# Patient Record
Sex: Male | Born: 1985
Health system: Southern US, Community
[De-identification: ages and names within clinical notes are randomized; demographics above are authoritative.]

## PROBLEM LIST (undated history)

## (undated) ENCOUNTER — Ambulatory Visit: Payer: Self-pay | Source: Home / Self Care

---

## 2013-02-18 ENCOUNTER — Encounter: Payer: Self-pay | Admitting: Podiatry

## 2013-02-18 ENCOUNTER — Ambulatory Visit (INDEPENDENT_AMBULATORY_CARE_PROVIDER_SITE_OTHER): Payer: BC Managed Care – PPO | Admitting: Podiatry

## 2013-02-18 VITALS — BP 105/67 | HR 82 | Resp 16 | Ht 74.0 in | Wt 250.0 lb

## 2013-02-18 DIAGNOSIS — M109 Gout, unspecified: Secondary | ICD-10-CM

## 2013-02-18 DIAGNOSIS — M2010 Hallux valgus (acquired), unspecified foot: Secondary | ICD-10-CM

## 2013-02-18 NOTE — Progress Notes (Signed)
Subjective:     Patient ID: Matthew Montoya, male   DOB: 10-Nov-1985, 27 y.o.   MRN: [id number]  HPI40% better but something is still not right. Patient points to the right first metatarsal head states it continues to bother him with shoe gear. Patient does state he has a significant family history of this problem   Review of Systems  All other systems reviewed and are negative.       Objective:   Physical Exam  Cardiovascular: Intact distal pulses.    neurological remains intact. I noted there to be a prominence around the first metatarsal head right with redness and soreness when pressed against the area     Assessment:     Structural hallux abductovalgus deformity with significant family history and possible trauma leading to an increase in the intermetatarsal angle.    Plan:     Reviewed health currently. Discussed deformity and treatment options. Patient feels the soreness is limiting for him and he cannot walk comfortably peer before starting his career he would like to have this corrected given his family history and the pain associated with it. I allowed him to read a consent form and gave him ample opportunities for questions concerning the correction of this deformity. Patient understands all risks as outlined and understands that total recovery. We'll take 6 months to one year. He was dispensed an air fracture walker for the immediate postoperative period and will be immobilized for 4-6 weeks. Scheduled for surgery in December after he returns from [place]. Encouraged to call with questions.

## 2013-02-18 NOTE — Patient Instructions (Signed)
Follow up preop  Instructions prior to surgery. Have a great cruise

## 2013-05-07 ENCOUNTER — Telehealth: Payer: Self-pay | Admitting: *Deleted

## 2013-05-07 NOTE — Telephone Encounter (Signed)
Pt states he was out of the country 05/04/2013 and would like to reschedule to 05/10/2013.  Dr Charlsie Merles states he would like to perform this case on 05/19/2013.  I informed the pt of Dr Beverlee Nims statement and the pt agreed.  I informed Aram Beecham at  [facility] of the change.

## 2013-05-10 ENCOUNTER — Encounter: Payer: BC Managed Care – PPO | Admitting: Podiatry

## 2013-05-19 ENCOUNTER — Encounter: Payer: Self-pay | Admitting: Podiatry

## 2013-05-19 DIAGNOSIS — M2010 Hallux valgus (acquired), unspecified foot: Secondary | ICD-10-CM

## 2013-05-26 ENCOUNTER — Encounter: Payer: BC Managed Care – PPO | Admitting: Podiatry

## 2013-05-27 ENCOUNTER — Encounter: Payer: Self-pay | Admitting: Podiatry

## 2013-05-27 ENCOUNTER — Ambulatory Visit (INDEPENDENT_AMBULATORY_CARE_PROVIDER_SITE_OTHER): Payer: BC Managed Care – PPO

## 2013-05-27 ENCOUNTER — Ambulatory Visit (INDEPENDENT_AMBULATORY_CARE_PROVIDER_SITE_OTHER): Payer: BC Managed Care – PPO | Admitting: Podiatry

## 2013-05-27 VITALS — BP 153/83 | HR 82 | Resp 17 | Ht 74.0 in | Wt 290.0 lb

## 2013-05-27 DIAGNOSIS — Z9889 Other specified postprocedural states: Secondary | ICD-10-CM

## 2013-05-27 DIAGNOSIS — R609 Edema, unspecified: Secondary | ICD-10-CM

## 2013-05-27 DIAGNOSIS — M2010 Hallux valgus (acquired), unspecified foot: Secondary | ICD-10-CM

## 2013-05-27 NOTE — Progress Notes (Signed)
Subjective:     Patient ID: Matthew Montoya, male   DOB: 07/16/1985, 28 y.o.   MRN: [id number]  HPI patient states that I'm doing very well with my right foot and walking without discomfort. He presents with boot 1 week after having [place] bunionectomy right foot   Review of Systems     Objective:   Physical Exam Neurovascular status intact with negative Homans sign noted and well-healed surgical site first MPJ right with wound edges well coapted and hallux in rectus position    Assessment:     Doing well post bunionectomy right with good structural alignment and good healing occurring at this time    Plan:     X-ray reviewed with patient and advised patient on range of motion exercises continued immobilization compression and elevation. Reappoint 3 weeks earlier if any issues should occur

## 2013-05-27 NOTE — Progress Notes (Signed)
Pt present today for POV1, right foot has no swelling or redness.

## 2013-05-31 NOTE — Progress Notes (Signed)
1) [place] bunionectomy right foot   

## 2013-06-17 ENCOUNTER — Encounter: Payer: BC Managed Care – PPO | Admitting: Podiatry

## 2013-06-21 ENCOUNTER — Ambulatory Visit (INDEPENDENT_AMBULATORY_CARE_PROVIDER_SITE_OTHER): Payer: BC Managed Care – PPO

## 2013-06-21 ENCOUNTER — Encounter: Payer: Self-pay | Admitting: Podiatry

## 2013-06-21 ENCOUNTER — Ambulatory Visit (INDEPENDENT_AMBULATORY_CARE_PROVIDER_SITE_OTHER): Payer: BC Managed Care – PPO | Admitting: Podiatry

## 2013-06-21 VITALS — BP 131/58 | HR 86 | Resp 12

## 2013-06-21 DIAGNOSIS — Z9889 Other specified postprocedural states: Secondary | ICD-10-CM

## 2013-06-21 DIAGNOSIS — M2010 Hallux valgus (acquired), unspecified foot: Secondary | ICD-10-CM

## 2013-06-21 NOTE — Progress Notes (Signed)
Subjective:     Patient ID: Matthew Montoya, male   DOB: [birth year], 28 y.o.   MRN: [id number]  HPI patient states my right foot is doing well but I have not yet started to wear shoes. 4 weeks after foot surgery   Review of Systems     Objective:   Physical Exam Neurovascular status intact with no health history changes noted and well-healed surgical site first MPJ right with good range of motion dorsi and plantarflexion    Assessment:     Healing well post bunion surgery right    Plan:     X-rays reviewed and instructed on range of motion exercises return to shoe gear and compression stocking dispensed the patient. Reappoint one month

## 2013-07-19 ENCOUNTER — Ambulatory Visit (INDEPENDENT_AMBULATORY_CARE_PROVIDER_SITE_OTHER): Payer: BC Managed Care – PPO

## 2013-07-19 ENCOUNTER — Encounter: Payer: BC Managed Care – PPO | Admitting: Podiatry

## 2013-07-19 ENCOUNTER — Ambulatory Visit (INDEPENDENT_AMBULATORY_CARE_PROVIDER_SITE_OTHER): Payer: BC Managed Care – PPO | Admitting: Podiatry

## 2013-07-19 VITALS — BP 124/81 | HR 80 | Resp 16 | Ht 74.0 in | Wt 300.0 lb

## 2013-07-19 DIAGNOSIS — Z9889 Other specified postprocedural states: Secondary | ICD-10-CM

## 2013-07-19 DIAGNOSIS — M2010 Hallux valgus (acquired), unspecified foot: Secondary | ICD-10-CM

## 2013-07-19 NOTE — Progress Notes (Signed)
Pt presents for POV3, states not having any problems.

## 2013-07-19 NOTE — Progress Notes (Signed)
Subjective:     Patient ID: Matthew Montoya, male   DOB: 1986/03/21, 28 y.o.   MRN: [id number]  HPI patient presents stating he is doing well with his right foot postop with mild swelling if he is been on his foot for to long. Several months after Matthew Montoya right foot   Review of Systems     Objective:   Physical Exam Neurovascular status intact with patient well oriented x3 and well-healing surgical site first MPJ right with good range of motion and no restriction    Assessment:     Healing well postop surgery right foot    Plan:     X-rays reviewed and discussed continuation of conservative care with gradual increase in activity levels. Reappoint for final visit as needed

## 2014-11-07 ENCOUNTER — Other Ambulatory Visit: Payer: Self-pay | Admitting: Nurse Practitioner

## 2014-11-07 DIAGNOSIS — M25561 Pain in right knee: Secondary | ICD-10-CM

## 2014-11-09 ENCOUNTER — Ambulatory Visit
Admission: RE | Admit: 2014-11-09 | Discharge: 2014-11-09 | Disposition: A | Discharge status: Home / Self Care | Payer: 59 | Source: Ambulatory Visit | Attending: Nurse Practitioner | Admitting: Nurse Practitioner

## 2014-11-09 DIAGNOSIS — M25561 Pain in right knee: Secondary | ICD-10-CM

## 2015-11-28 DIAGNOSIS — M9901 Segmental and somatic dysfunction of cervical region: Secondary | ICD-10-CM | POA: Diagnosis not present

## 2015-11-28 DIAGNOSIS — M9903 Segmental and somatic dysfunction of lumbar region: Secondary | ICD-10-CM | POA: Diagnosis not present

## 2015-11-28 DIAGNOSIS — M62830 Muscle spasm of back: Secondary | ICD-10-CM | POA: Diagnosis not present

## 2015-11-28 DIAGNOSIS — M9902 Segmental and somatic dysfunction of thoracic region: Secondary | ICD-10-CM | POA: Diagnosis not present

## 2015-12-07 DIAGNOSIS — F900 Attention-deficit hyperactivity disorder, predominantly inattentive type: Secondary | ICD-10-CM | POA: Diagnosis not present

## 2015-12-07 DIAGNOSIS — E669 Obesity, unspecified: Secondary | ICD-10-CM | POA: Diagnosis not present

## 2015-12-07 DIAGNOSIS — Z1389 Encounter for screening for other disorder: Secondary | ICD-10-CM | POA: Diagnosis not present

## 2016-02-07 DIAGNOSIS — M62830 Muscle spasm of back: Secondary | ICD-10-CM | POA: Diagnosis not present

## 2016-02-07 DIAGNOSIS — M9902 Segmental and somatic dysfunction of thoracic region: Secondary | ICD-10-CM | POA: Diagnosis not present

## 2016-02-07 DIAGNOSIS — M9903 Segmental and somatic dysfunction of lumbar region: Secondary | ICD-10-CM | POA: Diagnosis not present

## 2016-02-07 DIAGNOSIS — M9901 Segmental and somatic dysfunction of cervical region: Secondary | ICD-10-CM | POA: Diagnosis not present

## 2016-02-09 DIAGNOSIS — M9901 Segmental and somatic dysfunction of cervical region: Secondary | ICD-10-CM | POA: Diagnosis not present

## 2016-02-09 DIAGNOSIS — M9902 Segmental and somatic dysfunction of thoracic region: Secondary | ICD-10-CM | POA: Diagnosis not present

## 2016-02-09 DIAGNOSIS — M62830 Muscle spasm of back: Secondary | ICD-10-CM | POA: Diagnosis not present

## 2016-02-09 DIAGNOSIS — M9903 Segmental and somatic dysfunction of lumbar region: Secondary | ICD-10-CM | POA: Diagnosis not present

## 2016-02-12 DIAGNOSIS — M62830 Muscle spasm of back: Secondary | ICD-10-CM | POA: Diagnosis not present

## 2016-02-12 DIAGNOSIS — M9903 Segmental and somatic dysfunction of lumbar region: Secondary | ICD-10-CM | POA: Diagnosis not present

## 2016-02-12 DIAGNOSIS — M9902 Segmental and somatic dysfunction of thoracic region: Secondary | ICD-10-CM | POA: Diagnosis not present

## 2016-02-12 DIAGNOSIS — M9901 Segmental and somatic dysfunction of cervical region: Secondary | ICD-10-CM | POA: Diagnosis not present

## 2016-02-21 DIAGNOSIS — L700 Acne vulgaris: Secondary | ICD-10-CM | POA: Diagnosis not present

## 2016-02-22 DIAGNOSIS — M9901 Segmental and somatic dysfunction of cervical region: Secondary | ICD-10-CM | POA: Diagnosis not present

## 2016-02-22 DIAGNOSIS — M62830 Muscle spasm of back: Secondary | ICD-10-CM | POA: Diagnosis not present

## 2016-02-22 DIAGNOSIS — M9902 Segmental and somatic dysfunction of thoracic region: Secondary | ICD-10-CM | POA: Diagnosis not present

## 2016-02-22 DIAGNOSIS — M9903 Segmental and somatic dysfunction of lumbar region: Secondary | ICD-10-CM | POA: Diagnosis not present

## 2016-05-07 DIAGNOSIS — M9903 Segmental and somatic dysfunction of lumbar region: Secondary | ICD-10-CM | POA: Diagnosis not present

## 2016-05-07 DIAGNOSIS — M9902 Segmental and somatic dysfunction of thoracic region: Secondary | ICD-10-CM | POA: Diagnosis not present

## 2016-05-07 DIAGNOSIS — M62830 Muscle spasm of back: Secondary | ICD-10-CM | POA: Diagnosis not present

## 2016-05-07 DIAGNOSIS — M9901 Segmental and somatic dysfunction of cervical region: Secondary | ICD-10-CM | POA: Diagnosis not present

## 2016-08-13 DIAGNOSIS — M9903 Segmental and somatic dysfunction of lumbar region: Secondary | ICD-10-CM | POA: Diagnosis not present

## 2016-08-13 DIAGNOSIS — M62830 Muscle spasm of back: Secondary | ICD-10-CM | POA: Diagnosis not present

## 2016-08-13 DIAGNOSIS — M9901 Segmental and somatic dysfunction of cervical region: Secondary | ICD-10-CM | POA: Diagnosis not present

## 2016-08-13 DIAGNOSIS — M9902 Segmental and somatic dysfunction of thoracic region: Secondary | ICD-10-CM | POA: Diagnosis not present

## 2016-08-20 DIAGNOSIS — M9902 Segmental and somatic dysfunction of thoracic region: Secondary | ICD-10-CM | POA: Diagnosis not present

## 2016-08-20 DIAGNOSIS — M9901 Segmental and somatic dysfunction of cervical region: Secondary | ICD-10-CM | POA: Diagnosis not present

## 2016-08-20 DIAGNOSIS — M62830 Muscle spasm of back: Secondary | ICD-10-CM | POA: Diagnosis not present

## 2016-08-20 DIAGNOSIS — M9903 Segmental and somatic dysfunction of lumbar region: Secondary | ICD-10-CM | POA: Diagnosis not present

## 2016-08-23 DIAGNOSIS — M9902 Segmental and somatic dysfunction of thoracic region: Secondary | ICD-10-CM | POA: Diagnosis not present

## 2016-08-23 DIAGNOSIS — M9901 Segmental and somatic dysfunction of cervical region: Secondary | ICD-10-CM | POA: Diagnosis not present

## 2016-08-23 DIAGNOSIS — M62830 Muscle spasm of back: Secondary | ICD-10-CM | POA: Diagnosis not present

## 2016-08-23 DIAGNOSIS — M9903 Segmental and somatic dysfunction of lumbar region: Secondary | ICD-10-CM | POA: Diagnosis not present

## 2016-08-26 DIAGNOSIS — M9901 Segmental and somatic dysfunction of cervical region: Secondary | ICD-10-CM | POA: Diagnosis not present

## 2016-08-26 DIAGNOSIS — M9903 Segmental and somatic dysfunction of lumbar region: Secondary | ICD-10-CM | POA: Diagnosis not present

## 2016-08-26 DIAGNOSIS — M62830 Muscle spasm of back: Secondary | ICD-10-CM | POA: Diagnosis not present

## 2016-08-26 DIAGNOSIS — M9902 Segmental and somatic dysfunction of thoracic region: Secondary | ICD-10-CM | POA: Diagnosis not present

## 2016-08-30 DIAGNOSIS — M9913 Subluxation complex (vertebral) of lumbar region: Secondary | ICD-10-CM | POA: Diagnosis not present

## 2016-08-30 DIAGNOSIS — M9911 Subluxation complex (vertebral) of cervical region: Secondary | ICD-10-CM | POA: Diagnosis not present

## 2016-09-02 DIAGNOSIS — M9903 Segmental and somatic dysfunction of lumbar region: Secondary | ICD-10-CM | POA: Diagnosis not present

## 2016-09-02 DIAGNOSIS — M9901 Segmental and somatic dysfunction of cervical region: Secondary | ICD-10-CM | POA: Diagnosis not present

## 2016-09-02 DIAGNOSIS — M9902 Segmental and somatic dysfunction of thoracic region: Secondary | ICD-10-CM | POA: Diagnosis not present

## 2016-09-02 DIAGNOSIS — M9905 Segmental and somatic dysfunction of pelvic region: Secondary | ICD-10-CM | POA: Diagnosis not present

## 2016-09-05 DIAGNOSIS — M9905 Segmental and somatic dysfunction of pelvic region: Secondary | ICD-10-CM | POA: Diagnosis not present

## 2016-09-05 DIAGNOSIS — M9903 Segmental and somatic dysfunction of lumbar region: Secondary | ICD-10-CM | POA: Diagnosis not present

## 2016-09-05 DIAGNOSIS — M9902 Segmental and somatic dysfunction of thoracic region: Secondary | ICD-10-CM | POA: Diagnosis not present

## 2016-09-05 DIAGNOSIS — M9901 Segmental and somatic dysfunction of cervical region: Secondary | ICD-10-CM | POA: Diagnosis not present

## 2016-09-09 DIAGNOSIS — M9901 Segmental and somatic dysfunction of cervical region: Secondary | ICD-10-CM | POA: Diagnosis not present

## 2016-09-09 DIAGNOSIS — M9902 Segmental and somatic dysfunction of thoracic region: Secondary | ICD-10-CM | POA: Diagnosis not present

## 2016-09-09 DIAGNOSIS — M9905 Segmental and somatic dysfunction of pelvic region: Secondary | ICD-10-CM | POA: Diagnosis not present

## 2016-09-09 DIAGNOSIS — M9903 Segmental and somatic dysfunction of lumbar region: Secondary | ICD-10-CM | POA: Diagnosis not present

## 2016-10-14 DIAGNOSIS — W57XXXA Bitten or stung by nonvenomous insect and other nonvenomous arthropods, initial encounter: Secondary | ICD-10-CM | POA: Diagnosis not present

## 2016-10-14 DIAGNOSIS — R21 Rash and other nonspecific skin eruption: Secondary | ICD-10-CM | POA: Diagnosis not present

## 2016-10-14 DIAGNOSIS — Z6841 Body Mass Index (BMI) 40.0 and over, adult: Secondary | ICD-10-CM | POA: Diagnosis not present

## 2016-11-07 DIAGNOSIS — M545 Low back pain: Secondary | ICD-10-CM | POA: Diagnosis not present

## 2016-11-07 DIAGNOSIS — Z6838 Body mass index (BMI) 38.0-38.9, adult: Secondary | ICD-10-CM | POA: Diagnosis not present

## 2017-03-05 DIAGNOSIS — Z23 Encounter for immunization: Secondary | ICD-10-CM | POA: Diagnosis not present

## 2017-05-05 DIAGNOSIS — H04123 Dry eye syndrome of bilateral lacrimal glands: Secondary | ICD-10-CM | POA: Diagnosis not present

## 2021-01-06 ENCOUNTER — Ambulatory Visit
Admission: EM | Admit: 2021-01-06 | Discharge: 2021-01-06 | Disposition: A | Discharge status: Home / Self Care | Payer: BLUE CROSS/BLUE SHIELD | Attending: Urgent Care | Admitting: Urgent Care

## 2021-01-06 DIAGNOSIS — B86 Scabies: Secondary | ICD-10-CM | POA: Diagnosis not present

## 2021-01-06 MED ORDER — HYDROXYZINE HCL 25 MG PO TABS: 12.5000 mg | ORAL_TABLET | Freq: Three times a day (TID) | ORAL | 0 refills | Status: AC | PRN

## 2021-01-06 MED ORDER — PERMETHRIN 5 % EX CREA: TOPICAL_CREAM | CUTANEOUS | 1 refills | Status: DC

## 2021-01-06 NOTE — ED Triage Notes (Signed)
One month h/o pruritic rash on his BUE, face, back, trunk and RLE. Pt reports that he was told one week ago that he was exposed to scabies.

## 2021-01-06 NOTE — ED Provider Notes (Signed)
  [facility]   MRN: [id number] DOB: [birth year]  Subjective:   Matthew Montoya is a 35 y.o. male presenting for 1 month history of persistent and worsening itching rash with multiple spots over his arms, low back, chest and face.  Patient states that he was in very close contact with a person that turned out to have scabies.  Had prolonged exposure.  Denies eating any foods, taking new medications.  No exposure to poisonous plants.  He is not currently taking any medications and has no known food or drug allergies.  Denies past medical and surgical history.  History reviewed. No pertinent family history.  Social History   Tobacco Use   Smoking status: Never   Smokeless tobacco: Never  Substance Use Topics   Alcohol use: No    Comment: ocassional   Drug use: No    ROS   Objective:   Vitals: BP 120/82 (BP Location: Left Arm)   Pulse (!) 114   Temp 98.3 F (36.8 C) (Oral)   Resp 18   SpO2 96%   Physical Exam Constitutional:      General: He is not in acute distress.    Appearance: Normal appearance. He is well-developed. He is obese. He is not ill-appearing, toxic-appearing or diaphoretic.  HENT:     Head: Normocephalic and atraumatic.     Right Ear: External ear normal.     Left Ear: External ear normal.     Nose: Nose normal.     Mouth/Throat:     Pharynx: Oropharynx is clear.  Eyes:     General: No scleral icterus.       Right eye: No discharge.        Left eye: No discharge.     Extraocular Movements: Extraocular movements intact.     Pupils: Pupils are equal, round, and reactive to light.  Cardiovascular:     Rate and Rhythm: Normal rate.  Pulmonary:     Effort: Pulmonary effort is normal.  Musculoskeletal:     Cervical back: Normal range of motion.  Skin:    General: Skin is warm and dry.     Findings: Rash (Multiple excoriations and burrows scattered over his extremities, chest, low back, neck) present.  Neurological:     Mental  Status: He is alert and oriented to person, place, and time.  Psychiatric:        Mood and Affect: Mood normal.        Behavior: Behavior normal.        Thought Content: Thought content normal.        Judgment: Judgment normal.    Assessment and Plan :   PDMP not reviewed this encounter.  1. Scabies     Recommended treatment with permethrin cream, hydroxyzine for itching. Counseled patient on potential for adverse effects with medications prescribed/recommended today, ER and return-to-clinic precautions discussed, patient verbalized understanding.    Wallis Bamberg, PA-C 01/06/21 1049

## 2021-01-16 DIAGNOSIS — Z5321 Procedure and treatment not carried out due to patient leaving prior to being seen by health care provider: Secondary | ICD-10-CM | POA: Diagnosis not present

## 2021-01-16 DIAGNOSIS — M549 Dorsalgia, unspecified: Secondary | ICD-10-CM | POA: Insufficient documentation

## 2021-01-16 DIAGNOSIS — M6790 Unspecified disorder of synovium and tendon, unspecified site: Secondary | ICD-10-CM | POA: Diagnosis not present

## 2021-01-16 DIAGNOSIS — B86 Scabies: Secondary | ICD-10-CM | POA: Insufficient documentation

## 2021-01-17 ENCOUNTER — Emergency Department (HOSPITAL_COMMUNITY)
Admission: EM | Admit: 2021-01-17 | Discharge: 2021-01-17 | Disposition: A | Discharge status: Home / Self Care | Payer: BLUE CROSS/BLUE SHIELD | Attending: Emergency Medicine | Admitting: Emergency Medicine

## 2021-01-17 NOTE — ED Notes (Signed)
Patient states he is leaving d/t wait time. Encouraged to stay

## 2021-01-17 NOTE — ED Triage Notes (Signed)
Pt c/o tendonitis, scabies & back pain. Scabies x74yr, tendonitis x43yr, back pain x2wks. States medication helped, concerned w scarring.

## 2021-01-17 NOTE — ED Provider Notes (Signed)
Emergency Medicine Provider Triage Evaluation Note  Matthew Montoya , a 35 y.o. male  was evaluated in triage with multiple complaints.   Pt reports 1 year of scabies for which he has been treated with permetherin without relief.  Pt seeking additional treatment and is concerned about scarring from the scabies.   Pt also c/o 2 years of tinnitus in the L ear.  Denies previous evaluation of same. No headache, vision changes or loss of balance.  Pt also with 2 weeks of back pain.  No known injury, but reports several falls since it began.  No difficulty walking, loss of bowel or bladder control.  Review of Systems  Positive: Tinnitus, back pain, scabies Negative: Fever, chills, syncope  Physical Exam  BP (!) 135/97 (BP Location: Left Arm)   Pulse 95   Temp 98.1 F (36.7 C)   Resp 18   SpO2 100%  Gen:   Awake, no distress   Resp:  Normal effort  MSK:   Moves extremities without difficulty  Other:  Scratching consistently. Ambulatory with steady gait and without ataxia.  Medical Decision Making  Medically screening exam initiated at 12:21 AM.  Appropriate orders placed.  Matthew Montoya was informed that the remainder of the evaluation will be completed by another provider, this initial triage assessment does not replace that evaluation, and the importance of remaining in the ED until their evaluation is complete.  Multiple complaints.  X-ray of back pending.   Matthew Montoya, Matthew Montoya 01/17/21 0024    Dione Booze, MD 01/17/21 [phone]

## 2021-01-18 ENCOUNTER — Ambulatory Visit
Admission: EM | Admit: 2021-01-18 | Discharge: 2021-01-18 | Disposition: A | Discharge status: Home / Self Care | Payer: BLUE CROSS/BLUE SHIELD | Attending: Internal Medicine | Admitting: Internal Medicine

## 2021-01-18 ENCOUNTER — Other Ambulatory Visit: Payer: Self-pay

## 2021-01-18 DIAGNOSIS — H65193 Other acute nonsuppurative otitis media, bilateral: Secondary | ICD-10-CM

## 2021-01-18 DIAGNOSIS — M545 Low back pain, unspecified: Secondary | ICD-10-CM | POA: Diagnosis not present

## 2021-01-18 DIAGNOSIS — G8929 Other chronic pain: Secondary | ICD-10-CM | POA: Diagnosis not present

## 2021-01-18 DIAGNOSIS — B86 Scabies: Secondary | ICD-10-CM | POA: Diagnosis not present

## 2021-01-18 MED ORDER — PERMETHRIN 5 % EX CREA: TOPICAL_CREAM | CUTANEOUS | 1 refills | Status: DC

## 2021-01-18 MED ORDER — PREDNISONE 10 MG (21) PO TBPK: ORAL_TABLET | Freq: Every day | ORAL | 0 refills | Status: AC

## 2021-01-18 NOTE — Discharge Instructions (Addendum)
You have been prescribed permethrin cream to help with scabies again.  Please do not take until September 3.  You have also been prescribed prednisone steroid to help with back pain.  Please avoid any over-the-counter ibuprofen, Advil, Aleve while taking prednisone.  You may take Tylenol.  Alternate ice and heat application as well.  Follow-up with Friday contact information for spine specialist if pain does not improve.  You have fluid in bilateral ears that could be causing tinnitus.  Please take cetirizine and Flonase and follow-up with primary care if symptoms persist.

## 2021-01-18 NOTE — ED Provider Notes (Addendum)
[facility]    CSN: [id number] Arrival date & time: 01/18/21  9233      History   Chief Complaint Chief Complaint  Patient presents with   Back Pain    HPI Matthew Montoya is a 35 y.o. male.   Patient presents today with several complaints that include persistent scabies, tinnitus to left ear, bilateral lower back pain.  Patient states that he was treated for scabies on 01/06/2021 with permethrin and lesions have persisted over the past few months.  Denies urticaria.  Patient has tinnitus of left ear but denies pain. Tinnitus has been present for approximately 1 year.  Denies any head injury, dizziness, blurred vision, headache, any upper respiratory symptoms, fever.  Patient also has bilateral lower back pain that radiates into bilateral hips that been present for approximately 2 weeks.  Patient does have history of chronic back pain due to an old injury.  Patient states that flareups typically self resolve but this time it has not.  Has taken over-the-counter pain relievers including Tylenol and Motrin at home without any relief.  Patient is currently walking with a walker due to pain.  Denies any numbness or tingling in any extremities or back.  Denies any saddle anesthesia, urinary frequency, urinary burning, urinary or bowel incontinence.  Patient states that he has had imaging of his back in the past that showed a pinched nerve.   Back Pain  History reviewed. No pertinent past medical history.  There are no problems to display for this patient.   History reviewed. No pertinent surgical history.     Home Medications    Prior to Admission medications   Medication Sig Start Date End Date Taking? Authorizing Provider  predniSONE (STERAPRED UNI-PAK 21 TAB) 10 MG (21) TBPK tablet Take by mouth daily. Take 6 tabs by mouth daily  for 2 days, then 5 tabs for 2 days, then 4 tabs for 2 days, then 3 tabs for 2 days, 2 tabs for 2 days, then 1 tab by mouth daily for 2 days  01/18/21  Yes Lance Muss, FNP  hydrOXYzine (ATARAX/VISTARIL) 25 MG tablet Take 0.5-1 tablets (12.5-25 mg total) by mouth every 8 (eight) hours as needed for itching. 01/06/21   Wallis Bamberg, PA-C  permethrin (ELIMITE) 5 % cream Thoroughly massage cream (30 g for average adult) from head to soles of feet; leave on for 8 to 14 hours before removing (shower or bath). 01/18/21   Lance Muss, FNP    Family History History reviewed. No pertinent family history.  Social History Social History   Tobacco Use   Smoking status: Never   Smokeless tobacco: Never  Substance Use Topics   Alcohol use: No    Comment: ocassional   Drug use: No     Allergies   Patient has no known allergies.   Review of Systems Review of Systems Per HPI  Physical Exam Triage Vital Signs ED Triage Vitals  Enc Vitals Group     BP 01/18/21 0943 122/82     Pulse Rate 01/18/21 0943 (!) 105     Resp 01/18/21 0943 18     Temp 01/18/21 0943 98.6 F (37 C)     Temp Source 01/18/21 0943 Oral     SpO2 01/18/21 0943 96 %     Weight --      Height --      Head Circumference --      Peak Flow --  Pain Score 01/18/21 0942 8     Pain Loc --      Pain Edu? --      Excl. in GC? --    No data found.  Updated Vital Signs BP 122/82 (BP Location: Left Arm)   Pulse (!) 105   Temp 98.6 F (37 C) (Oral)   Resp 18   SpO2 96%   Visual Acuity Right Eye Distance:   Left Eye Distance:   Bilateral Distance:    Right Eye Near:   Left Eye Near:    Bilateral Near:     Physical Exam Constitutional:      Appearance: Normal appearance.  HENT:     Head: Normocephalic and atraumatic.     Right Ear: Ear canal normal. A middle ear effusion is present. Tympanic membrane is not erythematous or bulging.     Left Ear: Ear canal normal. A middle ear effusion is present. Tympanic membrane is not erythematous or bulging.     Nose: Nose normal.     Mouth/Throat:     Mouth: Mucous membranes are moist.     Pharynx: No  posterior oropharyngeal erythema.  Eyes:     Extraocular Movements: Extraocular movements intact.     Conjunctiva/sclera: Conjunctivae normal.  Cardiovascular:     Rate and Rhythm: Normal rate and regular rhythm.     Pulses: Normal pulses.     Heart sounds: Normal heart sounds.  Pulmonary:     Effort: Pulmonary effort is normal.     Breath sounds: Normal breath sounds.  Musculoskeletal:     Cervical back: Normal.     Thoracic back: Normal.     Lumbar back: Tenderness present. No bony tenderness. Negative right straight leg raise test and negative left straight leg raise test.     Comments: Tenderness to palpation throughout bilateral lower lumbar region.  Skin:    General: Skin is warm and dry.     Comments: Multiple scabbed over and burrowed areas present to left forearm.  Neurological:     General: No focal deficit present.     Mental Status: He is alert and oriented to person, place, and time. Mental status is at baseline.     Deep Tendon Reflexes: Reflexes are normal and symmetric.  Psychiatric:        Mood and Affect: Mood normal.        Behavior: Behavior normal.        Thought Content: Thought content normal.        Judgment: Judgment normal.     UC Treatments / Results  Labs (all labs ordered are listed, but only abnormal results are displayed) Labs Reviewed - No data to display  EKG   Radiology No results found.  Procedures Procedures (including critical care time)  Medications Ordered in UC Medications - No data to display  Initial Impression / Assessment and Plan / UC Course  I have reviewed the triage vital signs and the nursing notes.  Pertinent labs & imaging results that were available during my care of the patient were reviewed by me and considered in my medical decision making (see chart for details).     Will treat with another dose of permethrin cream for scabies.  Advised patient to start in 2 days as this will be 14 days from original  treatment.  Will treat low back pain with prednisone steroid taper to decrease inflammation.  Advised patient to avoid any over-the-counter NSAIDs while taking prednisone.  Patient may  take Tylenol.  Patient advised to alternate ice and heat application as well.  No imaging necessary at this time.  No red flags on exam.  Neurological exam normal.  Provided patient with contact information for spine specialist if pain persists.  Patient has bilateral middle ear effusions that could be causing tinnitus.  Patient advised to take cetirizine and Flonase.  Patient to follow-up with PCP if symptoms persist.Discussed strict return precautions. Patient verbalized understanding and is agreeable with plan.   Coding this is a level 4 due to several complaints addressed. Final Clinical Impressions(s) / UC Diagnoses   Final diagnoses:  Scabies  Chronic bilateral low back pain without sciatica  Acute MEE (middle ear effusion), bilateral     Discharge Instructions      You have been prescribed permethrin cream to help with scabies again.  Please do not take until September 3.  You have also been prescribed prednisone steroid to help with back pain.  Please avoid any over-the-counter ibuprofen, Advil, Aleve while taking prednisone.  You may take Tylenol.  Alternate ice and heat application as well.  Follow-up with Friday contact information for spine specialist if pain does not improve.  You have fluid in bilateral ears that could be causing tinnitus.  Please take cetirizine and Flonase and follow-up with primary care if symptoms persist.     ED Prescriptions     Medication Sig Dispense Auth. Provider   permethrin (ELIMITE) 5 % cream Thoroughly massage cream (30 g for average adult) from head to soles of feet; leave on for 8 to 14 hours before removing (shower or bath). 60 g Lance Muss, FNP   predniSONE (STERAPRED UNI-PAK 21 TAB) 10 MG (21) TBPK tablet Take by mouth daily. Take 6 tabs by mouth  daily  for 2 days, then 5 tabs for 2 days, then 4 tabs for 2 days, then 3 tabs for 2 days, 2 tabs for 2 days, then 1 tab by mouth daily for 2 days 42 tablet Lance Muss, FNP      PDMP not reviewed this encounter.   Lance Muss, FNP 01/18/21 1014    Lance Muss, [place] 01/18/21 1015

## 2021-01-18 NOTE — ED Triage Notes (Signed)
"  Scabies": "off and on for the past 3 months." States he has been having scars and "a person told me" they had scabies.   "Tinnitus left ear": states "it's just ringing and it's been going on for a year and I just haven't gone to the doctor about it."   "Back pain": onset 2 weeks ago described as 8/10 sharp, achy, that sometimes causes pt to collapse. States it's in lower back and hips, does not radiate elsewhere. Denies physical injury or trauma. States tried tylenol and motrin at home without relief.

## 2021-01-25 ENCOUNTER — Other Ambulatory Visit: Payer: Self-pay

## 2021-01-25 ENCOUNTER — Emergency Department
Admission: EM | Admit: 2021-01-25 | Discharge: 2021-01-25 | Disposition: A | Discharge status: Home / Self Care | Payer: BLUE CROSS/BLUE SHIELD | Attending: Emergency Medicine | Admitting: Emergency Medicine

## 2021-01-25 ENCOUNTER — Emergency Department: Payer: BLUE CROSS/BLUE SHIELD

## 2021-01-25 DIAGNOSIS — M545 Low back pain, unspecified: Secondary | ICD-10-CM | POA: Diagnosis present

## 2021-01-25 DIAGNOSIS — M5440 Lumbago with sciatica, unspecified side: Secondary | ICD-10-CM | POA: Diagnosis not present

## 2021-01-25 MED ORDER — DEXAMETHASONE SODIUM PHOSPHATE 10 MG/ML IJ SOLN
10.0000 mg | Freq: Once | INTRAMUSCULAR | Status: AC
  Administered 2021-01-25: 10 mg via INTRAMUSCULAR
  Filled 2021-01-25: qty 1

## 2021-01-25 MED ORDER — METHOCARBAMOL 500 MG PO TABS: 500.0000 mg | ORAL_TABLET | Freq: Four times a day (QID) | ORAL | 0 refills | Status: AC | PRN

## 2021-01-25 MED ORDER — METHOCARBAMOL 500 MG PO TABS
1000.0000 mg | ORAL_TABLET | Freq: Once | ORAL | Status: AC
  Administered 2021-01-25: 1000 mg via ORAL
  Filled 2021-01-25: qty 2

## 2021-01-25 MED ORDER — KETOROLAC TROMETHAMINE 30 MG/ML IJ SOLN
30.0000 mg | Freq: Once | INTRAMUSCULAR | Status: AC
  Administered 2021-01-25: 30 mg via INTRAMUSCULAR
  Filled 2021-01-25: qty 1

## 2021-01-25 MED ORDER — ETODOLAC 500 MG PO TABS: 500.0000 mg | ORAL_TABLET | Freq: Two times a day (BID) | ORAL | 0 refills | Status: AC

## 2021-01-25 NOTE — ED Provider Notes (Signed)
[facility] Emergency Department Provider Note  ____________________________________________  Time seen: Approximately 6:48 PM  I have reviewed the triage vital signs and the nursing notes.   HISTORY  Chief Complaint Back Pain    HPI Matthew Montoya is a 35 y.o. male who presents the emergency department complaining of ongoing lower back pain.  Patient denies any specific injury, has a history of a "pinched nerve".  Patient states that he has had increasing lower back pain despite treatment with the steroids and meloxicam.  No bowel or bladder dysfunction, saddle anesthesia or paresthesias.  Again no trauma.  No abdominal complaints.  No urinary complaints.       History reviewed. No pertinent past medical history.  There are no problems to display for this patient.   History reviewed. No pertinent surgical history.  Prior to Admission medications   Medication Sig Start Date End Date Taking? Authorizing Provider  etodolac (LODINE) 500 MG tablet Take 1 tablet (500 mg total) by mouth 2 (two) times daily. 01/25/21  Yes Azaylea Maves, Delorise Royals, PA-C  methocarbamol (ROBAXIN) 500 MG tablet Take 1 tablet (500 mg total) by mouth every 6 (six) hours as needed for muscle spasms. 01/25/21  Yes Alekzander Cardell, Delorise Royals, PA-C  hydrOXYzine (ATARAX/VISTARIL) 25 MG tablet Take 0.5-1 tablets (12.5-25 mg total) by mouth every 8 (eight) hours as needed for itching. 01/06/21   Wallis Bamberg, PA-C  permethrin (ELIMITE) 5 % cream Thoroughly massage cream (30 g for average adult) from head to soles of feet; leave on for 8 to 14 hours before removing (shower or bath). 01/18/21   Lance Muss, FNP  predniSONE (STERAPRED UNI-PAK 21 TAB) 10 MG (21) TBPK tablet Take by mouth daily. Take 6 tabs by mouth daily  for 2 days, then 5 tabs for 2 days, then 4 tabs for 2 days, then 3 tabs for 2 days, 2 tabs for 2 days, then 1 tab by mouth daily for 2 days 01/18/21   Lance Muss, FNP     Allergies Patient has no known allergies.  No family history on file.  Social History Social History   Tobacco Use   Smoking status: Never   Smokeless tobacco: Never  Substance Use Topics   Alcohol use: No    Comment: ocassional   Drug use: No     Review of Systems  Constitutional: No fever/chills Eyes: No visual changes. No discharge ENT: No upper respiratory complaints. Cardiovascular: no chest pain. Respiratory: no cough. No SOB. Gastrointestinal: No abdominal pain.  No nausea, no vomiting.  No diarrhea.  No constipation. Genitourinary: Negative for dysuria. No hematuria Musculoskeletal: Positive for lower back pain Skin: Negative for rash, abrasions, lacerations, ecchymosis. Neurological: Negative for headaches, focal weakness or numbness.  10 System ROS otherwise negative.  ____________________________________________   PHYSICAL EXAM:  VITAL SIGNS: ED Triage Vitals  Enc Vitals Group     BP 01/25/21 1756 (!) 141/95     Pulse Rate 01/25/21 1756 97     Resp 01/25/21 1756 20     Temp 01/25/21 1756 98 F (36.7 C)     Temp Source 01/25/21 1756 Oral     SpO2 01/25/21 1756 98 %     Weight 01/25/21 1757 (!) 312 lb (141.5 kg)     Height 01/25/21 1757 6\' 1"  (1.854 m)     Head Circumference --      Peak Flow --      Pain Score 01/25/21 1757 10  Pain Loc --      Pain Edu? --      Excl. in GC? --      Constitutional: Alert and oriented. Well appearing and in no acute distress. Eyes: Conjunctivae are normal. PERRL. EOMI. Head: Atraumatic. ENT:      Ears:       Nose: No congestion/rhinnorhea.      Mouth/Throat: Mucous membranes are moist.  Neck: No stridor.    Cardiovascular: Normal rate, regular rhythm. Normal S1 and S2.  Good peripheral circulation. Respiratory: Normal respiratory effort without tachypnea or retractions. Lungs CTAB. Good air entry to the bases with no decreased or absent breath sounds. Gastrointestinal: Bowel sounds 4 quadrants.  Soft and nontender to palpation. No guarding or rigidity. No palpable masses. No distention. No CVA tenderness. Musculoskeletal: Full range of motion to all extremities. No gross deformities appreciated.  Visualization of the lumbar spine reveals no visible signs of trauma.  Diffuse tenderness throughout the lumbar region without palpable abnormality or step-off.  Small extension into the SI joints bilaterally.  No sciatic notch tenderness bilaterally.  Dorsalis pedis pulses sensation intact and equal bilateral lower extremities. Neurologic:  Normal speech and language. No gross focal neurologic deficits are appreciated.  Skin:  Skin is warm, dry and intact. No rash noted. Psychiatric: Mood and affect are normal. Speech and behavior are normal. Patient exhibits appropriate insight and judgement.   ____________________________________________   LABS (all labs ordered are listed, but only abnormal results are displayed)  Labs Reviewed - No data to display ____________________________________________  EKG   ____________________________________________  RADIOLOGY I personally viewed and evaluated these images as part of my medical decision making, as well as reviewing the written report by the radiologist.  ED Provider Interpretation: Mild disc bulge in the L3-L4 and L4-L5 region.  No acute compression of the spinal column.  No evidence of fracture.  CT Lumbar Spine Wo Contrast  Result Date: 01/25/2021 CLINICAL DATA:  Initial evaluation for lower back pain for 1 month. EXAM: CT LUMBAR SPINE WITHOUT CONTRAST TECHNIQUE: Multidetector CT imaging of the lumbar spine was performed without intravenous contrast administration. Multiplanar CT image reconstructions were also generated. COMPARISON:  None. FINDINGS: Segmentation: Standard. Lowest well-formed disc space labeled the L5-S1 level. Alignment: 3 mm retrolisthesis of L5 on S1. Straightening of the normal lumbar lordosis. Vertebrae: Vertebral  body height maintained without acute or chronic fracture. Visualized sacrum and pelvis intact. SI joints symmetric and normal. No discrete osseous lesions. Paraspinal and other soft tissues: Unremarkable. Disc levels: L1-2:  Unremarkable. L2-3:  Unremarkable. L3-4: Minimal annular disc bulge. No spinal stenosis. Mild left L3 foraminal narrowing. Right neural foramen remains patent. L4-5: Mild disc bulge. Superimposed small central disc protrusion contacts the ventral thecal sac (series 6, image 87). No significant spinal stenosis. Mild left L4 foraminal narrowing. Right neural foramina remains patent. L5-S1: Degenerative intervertebral disc space narrowing with trace retrolisthesis. Superimposed central disc protrusion with annular calcification (series 6, image 42). Protruding disc closely approximates the descending S1 nerve roots without frank impingement. Minimal facet spurring. No canal or lateral recess stenosis. Mild bilateral L5 foraminal stenosis. IMPRESSION: 1. No acute abnormality within the lumbar spine. 2. Small central disc protrusions at L4-5 and L5-S1 without significant stenosis or frank impingement 3. Mild left L3, L4, and bilateral L5 foraminal narrowing. Electronically Signed   By: Rise Mu M.D.   On: 01/25/2021 19:55    ____________________________________________    PROCEDURES  Procedure(s) performed:    Procedures  Medications  ketorolac (TORADOL) 30 MG/ML injection 30 mg (has no administration in time range)  dexamethasone (DECADRON) injection 10 mg (has no administration in time range)  methocarbamol (ROBAXIN) tablet 1,000 mg (has no administration in time range)     ____________________________________________   INITIAL IMPRESSION / ASSESSMENT AND PLAN / ED COURSE  Pertinent labs & imaging results that were available during my care of the patient were reviewed by me and considered in my medical decision making (see chart for details).  Review of  the [place] CSRS was performed in accordance of the [facility] prior to dispensing any controlled drugs.           Patient's diagnosis is consistent with lumbago, intermittent sciatica.  Patient presents emergency department complaining of lower back pain.  This is been progressively worsening over the past several weeks.  Patient has been on Mobic for time, prednisone for a time has not had significant relief of symptoms.  Patient works in [employer] and sits for prolonged periods of time.  He states that the chair that he sits and does not have any kind of support and he feels that this is likely contributing to his symptoms.  Given the progressive nature despite conservative treatment I did proceed with CT.  Mild disc bulges identified but no acute findings warranting emergent neurosurgery intervention.  Patient will have referral to neurosurgery to follow-up and I will prescribe muscle relaxer, different anti-inflammatory for the patient.  Concerning signs and symptoms are discussed with the patient.  He can follow-up with neurosurgery..  Patient is given ED precautions to return to the ED for any worsening or new symptoms.     ____________________________________________  FINAL CLINICAL IMPRESSION(S) / ED DIAGNOSES  Final diagnoses:  Acute midline low back pain with sciatica, sciatica laterality unspecified      NEW MEDICATIONS STARTED DURING THIS VISIT:  ED Discharge Orders          Ordered    etodolac (LODINE) 500 MG tablet  2 times daily        01/25/21 2142    methocarbamol (ROBAXIN) 500 MG tablet  Every 6 hours PRN        01/25/21 2142                This chart was dictated using voice recognition software/Dragon. Despite best efforts to proofread, errors can occur which can change the meaning. Any change was purely unintentional.    Racheal Patches, PA-C 01/25/21 2143    Merwyn Katos, MD 01/25/21 [id number]

## 2021-01-25 NOTE — ED Triage Notes (Signed)
Pt to ED for lower back pain x1 month. Denies injury. States has been taking prednisone and mobic with no relief.

## 2021-03-30 DIAGNOSIS — M5126 Other intervertebral disc displacement, lumbar region: Secondary | ICD-10-CM | POA: Diagnosis not present

## 2021-03-30 DIAGNOSIS — M5416 Radiculopathy, lumbar region: Secondary | ICD-10-CM | POA: Diagnosis not present

## 2021-03-30 DIAGNOSIS — M5136 Other intervertebral disc degeneration, lumbar region: Secondary | ICD-10-CM | POA: Diagnosis not present

## 2021-05-23 DIAGNOSIS — R030 Elevated blood-pressure reading, without diagnosis of hypertension: Secondary | ICD-10-CM | POA: Diagnosis not present

## 2021-05-23 DIAGNOSIS — R7303 Prediabetes: Secondary | ICD-10-CM | POA: Diagnosis not present

## 2021-05-23 DIAGNOSIS — Z6841 Body Mass Index (BMI) 40.0 and over, adult: Secondary | ICD-10-CM | POA: Diagnosis not present

## 2021-05-23 DIAGNOSIS — J0100 Acute maxillary sinusitis, unspecified: Secondary | ICD-10-CM | POA: Diagnosis not present

## 2021-05-23 DIAGNOSIS — J069 Acute upper respiratory infection, unspecified: Secondary | ICD-10-CM | POA: Diagnosis not present

## 2021-06-01 ENCOUNTER — Other Ambulatory Visit: Payer: Self-pay

## 2021-06-01 ENCOUNTER — Ambulatory Visit
Admission: RE | Admit: 2021-06-01 | Discharge: 2021-06-01 | Disposition: A | Discharge status: Home / Self Care | Payer: BLUE CROSS/BLUE SHIELD | Source: Ambulatory Visit | Attending: Internal Medicine | Admitting: Internal Medicine

## 2021-06-01 VITALS — BP 144/90 | HR 79 | Temp 98.0°F | Resp 18 | Ht 73.0 in | Wt 308.0 lb

## 2021-06-01 DIAGNOSIS — J069 Acute upper respiratory infection, unspecified: Secondary | ICD-10-CM | POA: Diagnosis not present

## 2021-06-01 DIAGNOSIS — R112 Nausea with vomiting, unspecified: Secondary | ICD-10-CM | POA: Diagnosis not present

## 2021-06-01 MED ORDER — ONDANSETRON 4 MG PO TBDP: 4.0000 mg | ORAL_TABLET | Freq: Three times a day (TID) | ORAL | 0 refills | Status: AC | PRN

## 2021-06-01 MED ORDER — AMOXICILLIN 875 MG PO TABS: 875.0000 mg | ORAL_TABLET | Freq: Two times a day (BID) | ORAL | 0 refills | Status: AC

## 2021-06-01 NOTE — ED Triage Notes (Signed)
Patient c/o stuffy nose, lightheadedness, fatigue, vomiting x 10 days.  Patient has taken Melxoicam.  Patient is vaccinated for COVID.

## 2021-06-01 NOTE — Discharge Instructions (Signed)
You have been prescribed an antibiotic to treat upper respiratory infection as well as ondansetron to take as needed for nausea.  Please increase water intake.  Follow-up if symptoms persist or worsen.

## 2021-06-01 NOTE — ED Provider Notes (Signed)
[facility]    CSN: [id number] Arrival date & time: 06/01/21  1055      History   Chief Complaint Chief Complaint  Patient presents with   Appointment    Fatigue    HPI Matthew Montoya is a 36 y.o. male.   Patient presents with 2-week history of nasal congestion, dizziness, fatigue, nausea, vomiting.  He reports that he had a cough that resolved approximately 1 week ago.  Denies any known fevers.  He has been exposed to COVID-19 as well as other acute respiratory illnesses at his workplace.  He has taken meloxicam for his symptoms.  Denies chest pain, shortness of breath, sore throat, ear pain, diarrhea, abdominal pain.    History reviewed. No pertinent past medical history.  There are no problems to display for this patient.   History reviewed. No pertinent surgical history.     Home Medications    Prior to Admission medications   Medication Sig Start Date End Date Taking? Authorizing Provider  amoxicillin (AMOXIL) 875 MG tablet Take 1 tablet (875 mg total) by mouth 2 (two) times daily for 7 days. 06/01/21 06/08/21 Yes Lattie Cervi, Michele Rockers, FNP  ondansetron (ZOFRAN-ODT) 4 MG disintegrating tablet Take 1 tablet (4 mg total) by mouth every 8 (eight) hours as needed for nausea or vomiting. 06/01/21  Yes Jillaine Waren, Michele Rockers, FNP  etodolac (LODINE) 500 MG tablet Take 1 tablet (500 mg total) by mouth 2 (two) times daily. 01/25/21   Cuthriell, Charline Bills, PA-C  hydrOXYzine (ATARAX/VISTARIL) 25 MG tablet Take 0.5-1 tablets (12.5-25 mg total) by mouth every 8 (eight) hours as needed for itching. 01/06/21   Jaynee Eagles, PA-C  methocarbamol (ROBAXIN) 500 MG tablet Take 1 tablet (500 mg total) by mouth every 6 (six) hours as needed for muscle spasms. 01/25/21   Cuthriell, Charline Bills, PA-C  permethrin (ELIMITE) 5 % cream Thoroughly massage cream (30 g for average adult) from head to soles of feet; leave on for 8 to 14 hours before removing (shower or bath). 01/18/21   Teodora Medici, FNP   predniSONE (STERAPRED UNI-PAK 21 TAB) 10 MG (21) TBPK tablet Take by mouth daily. Take 6 tabs by mouth daily  for 2 days, then 5 tabs for 2 days, then 4 tabs for 2 days, then 3 tabs for 2 days, 2 tabs for 2 days, then 1 tab by mouth daily for 2 days 01/18/21   Teodora Medici, FNP    Family History No family history on file.  Social History Social History   Tobacco Use   Smoking status: Never   Smokeless tobacco: Never  Substance Use Topics   Alcohol use: No    Comment: ocassional   Drug use: No     Allergies   Patient has no known allergies.   Review of Systems Review of Systems Per HPI  Physical Exam Triage Vital Signs ED Triage Vitals  Enc Vitals Group     BP 06/01/21 1123 (!) 144/90     Pulse Rate 06/01/21 1123 79     Resp 06/01/21 1123 18     Temp 06/01/21 1123 98 F (36.7 C)     Temp Source 06/01/21 1123 Oral     SpO2 06/01/21 1123 96 %     Weight 06/01/21 1125 (!) 308 lb (139.7 kg)     Height 06/01/21 1125 6\' 1"  (1.854 m)     Head Circumference --      Peak Flow --  Pain Score 06/01/21 1124 0     Pain Loc --      Pain Edu? --      Excl. in [place]? --    No data found.  Updated Vital Signs BP (!) 144/90 (BP Location: Left Arm)    Pulse 79    Temp 98 F (36.7 C) (Oral)    Resp 18    Ht 6\' 1"  (1.854 m)    Wt (!) 308 lb (139.7 kg)    SpO2 96%    BMI 40.64 kg/m   Visual Acuity Right Eye Distance:   Left Eye Distance:   Bilateral Distance:    Right Eye Near:   Left Eye Near:    Bilateral Near:     Physical Exam Constitutional:      General: He is not in acute distress.    Appearance: Normal appearance. He is not toxic-appearing or diaphoretic.  HENT:     Head: Normocephalic and atraumatic.     Right Ear: Tympanic membrane and ear canal normal.     Left Ear: Tympanic membrane and ear canal normal.     Nose: Congestion present.     Mouth/Throat:     Mouth: Mucous membranes are moist.     Pharynx: No posterior oropharyngeal erythema.  Eyes:      Extraocular Movements: Extraocular movements intact.     Conjunctiva/sclera: Conjunctivae normal.     Pupils: Pupils are equal, round, and reactive to light.  Cardiovascular:     Rate and Rhythm: Normal rate and regular rhythm.     Pulses: Normal pulses.     Heart sounds: Normal heart sounds.  Pulmonary:     Effort: Pulmonary effort is normal. No respiratory distress.     Breath sounds: Normal breath sounds. No stridor. No wheezing, rhonchi or rales.  Abdominal:     General: Bowel sounds are normal. There is no distension.     Palpations: Abdomen is soft.     Tenderness: There is no abdominal tenderness.  Musculoskeletal:        General: Normal range of motion.     Cervical back: Normal range of motion.  Skin:    General: Skin is warm and dry.  Neurological:     General: No focal deficit present.     Mental Status: He is alert and oriented to person, place, and time. Mental status is at baseline.  Psychiatric:        Mood and Affect: Mood normal.        Behavior: Behavior normal.     UC Treatments / Results  Labs (all labs ordered are listed, but only abnormal results are displayed) Labs Reviewed - No data to display  EKG   Radiology No results found.  Procedures Procedures (including critical care time)  Medications Ordered in UC Medications - No data to display  Initial Impression / Assessment and Plan / UC Course  I have reviewed the triage vital signs and the nursing notes.  Pertinent labs & imaging results that were available during my care of the patient were reviewed by me and considered in my medical decision making (see chart for details).     Will opt to treat with amoxicillin antibiotic given duration of patient's upper respiratory symptoms.  Do not think that chest imaging is necessary given that cough has resolved and there are no adventitious lung sounds.  Ondansetron prescribed to take as needed for nausea and vomiting.  Do not think that viral  testing  is necessary given duration of symptoms.  Discussed return precautions.  Discussed symptomatic treatment.  Patient verbalized understanding and was agreeable with plan. Final Clinical Impressions(s) / UC Diagnoses   Final diagnoses:  Acute upper respiratory infection  Nausea and vomiting, unspecified vomiting type     Discharge Instructions      You have been prescribed an antibiotic to treat upper respiratory infection as well as ondansetron to take as needed for nausea.  Please increase water intake.  Follow-up if symptoms persist or worsen.    ED Prescriptions     Medication Sig Dispense Auth. Provider   ondansetron (ZOFRAN-ODT) 4 MG disintegrating tablet Take 1 tablet (4 mg total) by mouth every 8 (eight) hours as needed for nausea or vomiting. 20 tablet [place], [place] E, [place]   amoxicillin (AMOXIL) 875 MG tablet Take 1 tablet (875 mg total) by mouth 2 (two) times daily for 7 days. 14 tablet [place], Michele Rockers, [place]      PDMP not reviewed this encounter.   Teodora Medici, [place] 06/01/21 1209

## 2021-06-08 DIAGNOSIS — S43005A Unspecified dislocation of left shoulder joint, initial encounter: Secondary | ICD-10-CM | POA: Diagnosis not present

## 2021-06-08 DIAGNOSIS — M25512 Pain in left shoulder: Secondary | ICD-10-CM | POA: Diagnosis not present

## 2021-06-08 DIAGNOSIS — R58 Hemorrhage, not elsewhere classified: Secondary | ICD-10-CM | POA: Diagnosis not present

## 2021-07-05 DIAGNOSIS — M9901 Segmental and somatic dysfunction of cervical region: Secondary | ICD-10-CM | POA: Diagnosis not present

## 2021-07-05 DIAGNOSIS — M5412 Radiculopathy, cervical region: Secondary | ICD-10-CM | POA: Diagnosis not present

## 2021-07-05 DIAGNOSIS — M62830 Muscle spasm of back: Secondary | ICD-10-CM | POA: Diagnosis not present

## 2021-07-19 ENCOUNTER — Encounter: Payer: Self-pay | Admitting: Emergency Medicine

## 2021-07-19 ENCOUNTER — Other Ambulatory Visit: Payer: Self-pay

## 2021-07-19 ENCOUNTER — Ambulatory Visit
Admission: EM | Admit: 2021-07-19 | Discharge: 2021-07-19 | Disposition: A | Discharge status: Home / Self Care | Payer: BLUE CROSS/BLUE SHIELD | Attending: Internal Medicine | Admitting: Internal Medicine

## 2021-07-19 DIAGNOSIS — R233 Spontaneous ecchymoses: Secondary | ICD-10-CM

## 2021-07-19 NOTE — ED Provider Notes (Signed)
?[facility] ? ? ? ?CSN: [id number] ?Arrival date & time: 07/19/21  1619 ? ? ?  ? ?History   ?Chief Complaint ?Chief Complaint  ?Patient presents with  ? Bruised Shoulder  ? ? ?HPI ?Matthew Montoya is a 36 y.o. male.  ? ?Patient presents for further evaluation of a bruise to left anterior shoulder that has been present for approximately 1 month.  He denies any color change since it occurred.  Denies any injury to the area.  Denies any pain to the area.  Has full range of motion of area and shoulder.  Denies any fevers.  Denies any bruising in any other part of the body.  Denies any recent headache, dizziness, blurred vision, chest pain, shortness of breath, blood in stool. ? ? ? ?History reviewed. No pertinent past medical history. ? ?There are no problems to display for this patient. ? ? ?History reviewed. No pertinent surgical history. ? ? ? ? ?Home Medications   ? ?Prior to Admission medications   ?Medication Sig Start Date End Date Taking? Authorizing Provider  ?etodolac (LODINE) 500 MG tablet Take 1 tablet (500 mg total) by mouth 2 (two) times daily. 01/25/21   Cuthriell, Delorise Royals, PA-C  ?hydrOXYzine (ATARAX/VISTARIL) 25 MG tablet Take 0.5-1 tablets (12.5-25 mg total) by mouth every 8 (eight) hours as needed for itching. 01/06/21   Wallis Bamberg, PA-C  ?methocarbamol (ROBAXIN) 500 MG tablet Take 1 tablet (500 mg total) by mouth every 6 (six) hours as needed for muscle spasms. 01/25/21   Cuthriell, Delorise Royals, PA-C  ?ondansetron (ZOFRAN-ODT) 4 MG disintegrating tablet Take 1 tablet (4 mg total) by mouth every 8 (eight) hours as needed for nausea or vomiting. 06/01/21   Gustavus Bryant, FNP  ?permethrin (ELIMITE) 5 % cream Thoroughly massage cream (30 g for average adult) from head to soles of feet; leave on for 8 to 14 hours before removing (shower or bath). 01/18/21   Gustavus Bryant, FNP  ?predniSONE (STERAPRED UNI-PAK 21 TAB) 10 MG (21) TBPK tablet Take by mouth daily. Take 6 tabs by mouth daily  for 2  days, then 5 tabs for 2 days, then 4 tabs for 2 days, then 3 tabs for 2 days, 2 tabs for 2 days, then 1 tab by mouth daily for 2 days 01/18/21   Gustavus Bryant, FNP  ? ? ?Family History ?History reviewed. No pertinent family history. ? ?Social History ?Social History  ? ?Tobacco Use  ? Smoking status: Never  ? Smokeless tobacco: Never  ?Substance Use Topics  ? Alcohol use: No  ?  Comment: ocassional  ? Drug use: No  ? ? ? ?Allergies   ?Patient has no known allergies. ? ? ?Review of Systems ?Review of Systems ?Per HPI ? ?Physical Exam ?Triage Vital Signs ?ED Triage Vitals  ?Enc Vitals Group  ?   BP 07/19/21 1636 (!) 150/90  ?   Pulse Rate 07/19/21 1636 (!) 114  ?   Resp 07/19/21 1636 18  ?   Temp 07/19/21 1636 98.1 ?F (36.7 ?C)  ?   Temp Source 07/19/21 1636 Oral  ?   SpO2 07/19/21 1636 95 %  ?   Weight 07/19/21 1637 (!) 308 lb (139.7 kg)  ?   Height 07/19/21 1637 6\' 1"  (1.854 m)  ?   Head Circumference --   ?   Peak Flow --   ?   Pain Score 07/19/21 1637 0  ?   Pain Loc --   ?  Pain Edu? --   ?   Excl. in [place]? --   ? ?No data found. ? ?Updated Vital Signs ?BP (!) 150/90 (BP Location: Left Arm)   Pulse (!) 114   Temp 98.1 ?F (36.7 ?C) (Oral)   Resp 18   Ht 6\' 1"  (1.854 m)   Wt (!) 308 lb (139.7 kg)   SpO2 95%   BMI 40.64 kg/m?  ? ?Visual Acuity ?Right Eye Distance:   ?Left Eye Distance:   ?Bilateral Distance:   ? ?Right Eye Near:   ?Left Eye Near:    ?Bilateral Near:    ? ?Physical Exam ?Constitutional:   ?   General: He is not in acute distress. ?   Appearance: Normal appearance. He is not toxic-appearing.  ?HENT:  ?   Head: Normocephalic and atraumatic.  ?Eyes:  ?   Extraocular Movements: Extraocular movements intact.  ?   Conjunctiva/sclera: Conjunctivae normal.  ?Cardiovascular:  ?   Rate and Rhythm: Normal rate and regular rhythm.  ?   Pulses: Normal pulses.  ?   Heart sounds: Normal heart sounds.  ?Pulmonary:  ?   Effort: Pulmonary effort is normal. No respiratory distress.  ?   Breath sounds: Normal  breath sounds.  ?Skin: ?   Comments: Approximately 2 inch x 1 inch bluish discoloration present to left anterior shoulder directly above clavicle.  No color change with palpation.  No tenderness to palpation.  Has full range of motion of upper extremity.  ?Neurological:  ?   General: No focal deficit present.  ?   Mental Status: He is alert and oriented to person, place, and time. Mental status is at baseline.  ?Psychiatric:     ?   Mood and Affect: Mood normal.     ?   Behavior: Behavior normal.     ?   Thought Content: Thought content normal.     ?   Judgment: Judgment normal.  ? ? ? ?UC Treatments / Results  ?Labs ?(all labs ordered are listed, but only abnormal results are displayed) ?Labs Reviewed  ?CBC  ?COMPREHENSIVE METABOLIC PANEL  ? ? ?EKG ? ? ?Radiology ?No results found. ? ?Procedures ?Procedures (including critical care time) ? ?Medications Ordered in UC ?Medications - No data to display ? ?Initial Impression / Assessment and Plan / UC Course  ?I have reviewed the triage vital signs and the nursing notes. ? ?Pertinent labs & imaging results that were available during my care of the patient were reviewed by me and considered in my medical decision making (see chart for details). ? ?  ? ?Unable to determine the etiology of discoloration of patient's skin.  Will obtain CMP and CBC.  Patient has PCP and advised patient to make an appointment in the next few days for further evaluation and management.  I do not think the patient is in need of immediate medical attention at the hospital at this time.  Discussed return precautions.  Patient verbalized understanding and was agreeable with plan. ?Final Clinical Impressions(s) / UC Diagnoses  ? ?Final diagnoses:  ?Abnormal bruising  ? ? ? ?Discharge Instructions   ? ?  ?Blood Work is pending.  Please follow-up with primary care doctor tomorrow to schedule an appointment for further evaluation and management. ? ? ? ?ED Prescriptions   ?None ?  ? ?PDMP not reviewed  this encounter. ?  ? , Gustavus Bryant ?07/19/21 1656 ? ?

## 2021-07-19 NOTE — Discharge Instructions (Signed)
Blood Work is pending.  Please follow-up with primary care doctor tomorrow to schedule an appointment for further evaluation and management. ?

## 2021-07-19 NOTE — ED Triage Notes (Signed)
Patient c/o a bruise on his left shoulder x 1 month, no apparent injury to the area.  Patient is concerned, the area is not painful. ?

## 2021-07-20 LAB — COMPREHENSIVE METABOLIC PANEL
ALT: 74 IU/L — ABNORMAL HIGH (ref 0–44)
AST: 31 IU/L (ref 0–40)
Albumin/Globulin Ratio: 1.7 (ref 1.2–2.2)
Albumin: 4.4 g/dL (ref 4.0–5.0)
Alkaline Phosphatase: 92 IU/L (ref 44–121)
BUN/Creatinine Ratio: 13 (ref 9–20)
BUN: 15 mg/dL (ref 6–20)
Bilirubin Total: <0.2 mg/dL (ref 0.0–1.2)
CO2: 20 mmol/L (ref 20–29)
Calcium: 9.1 mg/dL (ref 8.7–10.2)
Chloride: 104 mmol/L (ref 96–106)
Creatinine, Ser: 1.12 mg/dL (ref 0.76–1.27)
Globulin, Total: 2.6 g/dL (ref 1.5–4.5)
Glucose: 113 mg/dL — ABNORMAL HIGH (ref 70–99)
Potassium: 4.4 mmol/L (ref 3.5–5.2)
Sodium: 140 mmol/L (ref 134–144)
Total Protein: 7 g/dL (ref 6.0–8.5)
eGFR: 88 mL/min/{1.73_m2} (ref >59)

## 2021-07-20 LAB — CBC
Hematocrit: 47.7 % (ref 37.5–51.0)
Hemoglobin: 15.7 g/dL (ref 13.0–17.7)
MCH: 28.4 pg (ref 26.6–33.0)
MCHC: 32.9 g/dL (ref 31.5–35.7)
MCV: 86 fL (ref 79–97)
Platelets: 303 10*3/uL (ref 150–450)
RBC: 5.53 x10E6/uL (ref 4.14–5.80)
RDW: 13.3 % (ref 11.6–15.4)
WBC: 5.9 10*3/uL (ref 3.4–10.8)

## 2021-07-24 NOTE — Progress Notes (Signed)
Slightly elevated ALT. Likely unremarkable but patient should follow up with PCP to monitor. Attempted to call patient. No answer. LVM for a call back.

## 2021-10-17 DIAGNOSIS — Z0000 Encounter for general adult medical examination without abnormal findings: Secondary | ICD-10-CM | POA: Diagnosis not present

## 2021-10-24 DIAGNOSIS — Z1339 Encounter for screening examination for other mental health and behavioral disorders: Secondary | ICD-10-CM | POA: Diagnosis not present

## 2021-10-24 DIAGNOSIS — Z1331 Encounter for screening for depression: Secondary | ICD-10-CM | POA: Diagnosis not present

## 2021-10-24 DIAGNOSIS — Z0000 Encounter for general adult medical examination without abnormal findings: Secondary | ICD-10-CM | POA: Diagnosis not present

## 2021-10-24 DIAGNOSIS — M5416 Radiculopathy, lumbar region: Secondary | ICD-10-CM | POA: Diagnosis not present

## 2021-10-24 DIAGNOSIS — R7401 Elevation of levels of liver transaminase levels: Secondary | ICD-10-CM | POA: Diagnosis not present

## 2021-11-28 DIAGNOSIS — M9901 Segmental and somatic dysfunction of cervical region: Secondary | ICD-10-CM | POA: Diagnosis not present

## 2021-11-28 DIAGNOSIS — M62830 Muscle spasm of back: Secondary | ICD-10-CM | POA: Diagnosis not present

## 2021-11-28 DIAGNOSIS — M5412 Radiculopathy, cervical region: Secondary | ICD-10-CM | POA: Diagnosis not present

## 2021-12-03 DIAGNOSIS — M5412 Radiculopathy, cervical region: Secondary | ICD-10-CM | POA: Diagnosis not present

## 2021-12-03 DIAGNOSIS — M9901 Segmental and somatic dysfunction of cervical region: Secondary | ICD-10-CM | POA: Diagnosis not present

## 2021-12-03 DIAGNOSIS — M62830 Muscle spasm of back: Secondary | ICD-10-CM | POA: Diagnosis not present

## 2021-12-04 DIAGNOSIS — M5440 Lumbago with sciatica, unspecified side: Secondary | ICD-10-CM | POA: Diagnosis not present

## 2021-12-04 DIAGNOSIS — M5441 Lumbago with sciatica, right side: Secondary | ICD-10-CM | POA: Diagnosis not present

## 2021-12-04 DIAGNOSIS — M5442 Lumbago with sciatica, left side: Secondary | ICD-10-CM | POA: Diagnosis not present

## 2021-12-18 DIAGNOSIS — M48062 Spinal stenosis, lumbar region with neurogenic claudication: Secondary | ICD-10-CM | POA: Diagnosis not present

## 2021-12-18 DIAGNOSIS — M5441 Lumbago with sciatica, right side: Secondary | ICD-10-CM | POA: Diagnosis not present

## 2021-12-18 DIAGNOSIS — E669 Obesity, unspecified: Secondary | ICD-10-CM | POA: Diagnosis not present

## 2021-12-18 DIAGNOSIS — M546 Pain in thoracic spine: Secondary | ICD-10-CM | POA: Diagnosis not present

## 2021-12-31 DIAGNOSIS — M5441 Lumbago with sciatica, right side: Secondary | ICD-10-CM | POA: Diagnosis not present

## 2021-12-31 DIAGNOSIS — M48062 Spinal stenosis, lumbar region with neurogenic claudication: Secondary | ICD-10-CM | POA: Diagnosis not present

## 2022-01-02 DIAGNOSIS — R7301 Impaired fasting glucose: Secondary | ICD-10-CM | POA: Diagnosis not present

## 2022-02-18 ENCOUNTER — Encounter: Payer: BLUE CROSS/BLUE SHIELD | Attending: Physical Medicine & Rehabilitation | Admitting: Dietician

## 2022-02-18 ENCOUNTER — Encounter: Payer: Self-pay | Admitting: Dietician

## 2022-02-18 DIAGNOSIS — Z6841 Body Mass Index (BMI) 40.0 and over, adult: Secondary | ICD-10-CM | POA: Diagnosis not present

## 2022-02-18 DIAGNOSIS — E66813 Obesity, class 3: Secondary | ICD-10-CM

## 2022-02-18 NOTE — Patient Instructions (Signed)
Add more low carb veggies with meals; include a fruit 2 or more times a day.  Decrease starches especially breads Eat smaller amounts of cheese, or less often.  Choose healthy options for snacks such as fruit, fruit and yogurt, english muffin pizza with part skim mozzarella and [place] pepperoni with fresh tomato or 1-2 Tablespoons pizza sauce Practice the "5 Ds" to help manage food cravings. Avoid feeling guilty for including occasional treats or higher calorie foods.  Gradually increase physical activity by starting with light activity and bit by bit increase the duration and intensity.
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
## 2022-04-05 ENCOUNTER — Ambulatory Visit: Payer: BLUE CROSS/BLUE SHIELD | Admitting: Dietician

## 2022-04-15 ENCOUNTER — Ambulatory Visit: Payer: BLUE CROSS/BLUE SHIELD | Admitting: Dietician

## 2022-05-06 ENCOUNTER — Encounter: Payer: Self-pay | Admitting: Dietician

## 2022-05-06 NOTE — Progress Notes (Signed)
Have not heard back from patient to reschedule his missed appointment from 04/15/22. Sent notification to referring provider.

## 2022-07-09 ENCOUNTER — Other Ambulatory Visit: Payer: Self-pay

## 2022-07-09 ENCOUNTER — Ambulatory Visit
Admission: EM | Admit: 2022-07-09 | Discharge: 2022-07-09 | Disposition: A | Discharge status: Home / Self Care | Payer: BLUE CROSS/BLUE SHIELD | Attending: Physician Assistant | Admitting: Physician Assistant

## 2022-07-09 ENCOUNTER — Encounter: Payer: Self-pay | Admitting: Emergency Medicine

## 2022-07-09 DIAGNOSIS — B86 Scabies: Secondary | ICD-10-CM

## 2022-07-09 MED ORDER — PERMETHRIN 5 % EX CREA: TOPICAL_CREAM | CUTANEOUS | 1 refills | Status: AC

## 2022-07-09 NOTE — ED Provider Notes (Signed)
[facility]    CSN: [id number] Arrival date & time: 07/09/22  0940      History   Chief Complaint Chief Complaint  Patient presents with   Rash    HPI Matthew Montoya is a 37 y.o. male.   37 year old male presents with scabies around the face and arms.  Patient indicates that he has been around a friend who has known scabies.  Patient indicates over the past several days he noticed itching and having skin irritation in his beard.  Patient indicates that he shaved the beard off and that he used a little bit of the permethrin that he had leftover which improved and decrease the itching.  Patient indicates he still have some areas on the arms but he does not have enough permethrin to the left complete treatment.  Patient request refill of the permethrin.  Patient indicates he is without fever, chills, tolerating fluids well.   Rash   History reviewed. No pertinent past medical history.  There are no problems to display for this patient.   History reviewed. No pertinent surgical history.     Home Medications    Prior to Admission medications   Medication Sig Start Date End Date Taking? Authorizing Provider  etodolac (LODINE) 500 MG tablet Take 1 tablet (500 mg total) by mouth 2 (two) times daily. 01/25/21   Cuthriell, Charline Bills, PA-C  hydrOXYzine (ATARAX/VISTARIL) 25 MG tablet Take 0.5-1 tablets (12.5-25 mg total) by mouth every 8 (eight) hours as needed for itching. 01/06/21   Jaynee Eagles, PA-C  methocarbamol (ROBAXIN) 500 MG tablet Take 1 tablet (500 mg total) by mouth every 6 (six) hours as needed for muscle spasms. 01/25/21   Cuthriell, Charline Bills, PA-C  ondansetron (ZOFRAN-ODT) 4 MG disintegrating tablet Take 1 tablet (4 mg total) by mouth every 8 (eight) hours as needed for nausea or vomiting. 06/01/21   Teodora Medici, FNP  permethrin (ELIMITE) 5 % cream Thoroughly massage cream (30 g for average adult) from head to soles of feet; leave on for 8 to 14 hours  before removing (shower or bath). 07/09/22   Nyoka Lint, PA-C  phentermine 15 MG capsule  10/06/20   [provider]  predniSONE (STERAPRED UNI-PAK 21 TAB) 10 MG (21) TBPK tablet Take by mouth daily. Take 6 tabs by mouth daily  for 2 days, then 5 tabs for 2 days, then 4 tabs for 2 days, then 3 tabs for 2 days, 2 tabs for 2 days, then 1 tab by mouth daily for 2 days Patient not taking: Reported on 07/09/2022 01/18/21   Teodora Medici, FNP    Family History History reviewed. No pertinent family history.  Social History Social History   Tobacco Use   Smoking status: Never   Smokeless tobacco: Never  Substance Use Topics   Alcohol use: Yes    Alcohol/week: 4.0 standard drinks of alcohol    Types: 4 Standard drinks or equivalent per week   Drug use: No     Allergies   Patient has no known allergies.   Review of Systems Review of Systems  Skin:  Positive for rash (face and arms).     Physical Exam Triage Vital Signs ED Triage Vitals [07/09/22 1021]  Enc Vitals Group     BP 125/85     Pulse Rate 95     Resp 18     Temp 98 F (36.7 C)     Temp Source Oral  SpO2 95 %     Weight      Height      Head Circumference      Peak Flow      Pain Score 0     Pain Loc      Pain Edu?      Excl. in [place]?    No data found.  Updated Vital Signs BP 125/85 (BP Location: Left Arm)   Pulse 95   Temp 98 F (36.7 C) (Oral)   Resp 18   SpO2 95%   Visual Acuity Right Eye Distance:   Left Eye Distance:   Bilateral Distance:    Right Eye Near:   Left Eye Near:    Bilateral Near:     Physical Exam Constitutional:      Appearance: Normal appearance.  Skin:         Comments: Face: Diffuse papular areas are present along the cheek, jawline, and chin.  There is no drainage.  The areas are grouped.  There are some maculopapular areas that are not associated with the beard line on the face.  Bilateral forearms scattered maculopapular eruptions noted on the anterior for  Lyme's that are diffuse and only single areas present, the rest of the habitus is clear.  Neurological:     Mental Status: He is alert.      UC Treatments / Results  Labs (all labs ordered are listed, but only abnormal results are displayed) Labs Reviewed - No data to display  EKG   Radiology No results found.  Procedures Procedures (including critical care time)  Medications Ordered in UC Medications - No data to display  Initial Impression / Assessment and Plan / UC Course  I have reviewed the triage vital signs and the nursing notes.  Pertinent labs & imaging results that were available during my care of the patient were reviewed by me and considered in my medical decision making (see chart for details).    Plan: The diagnosis will be treated with the following: 1.  Scabies: A.  Permethrin 5% cream, apply from head to toes, leave on 8 hours, then wash off in the shower, repeat in 7 days.  Advised to wash close and linens in hot water. 2.  Advised follow-up PCP or return to urgent care as needed. Final Clinical Impressions(s) / UC Diagnoses   Final diagnoses:  Scabies     Discharge Instructions      Advised to use the permethrin as directed, apply leave on 8 hours, then wash off with soap and water in the shower.  Make sure to wash all close in bed linens in hot water.  Repeat in 7 days to ensure treatment is successful.  Advised follow-up PCP or return to urgent care as needed.    ED Prescriptions     Medication Sig Dispense Auth. Provider   permethrin (ELIMITE) 5 % cream Thoroughly massage cream (30 g for average adult) from head to soles of feet; leave on for 8 to 14 hours before removing (shower or bath). 60 g Nyoka Lint, PA-C      PDMP not reviewed this encounter.   Nyoka Lint, PA-C 07/09/22 1038

## 2022-07-09 NOTE — ED Triage Notes (Signed)
Pt here for itchy rash that he thinks is scabies; pt sts hx of same and noticed itching x 2 days

## 2022-07-09 NOTE — Discharge Instructions (Addendum)
Advised to use the permethrin as directed, apply leave on 8 hours, then wash off with soap and water in the shower.  Make sure to wash all close in bed linens in hot water.  Repeat in 7 days to ensure treatment is successful.  Advised follow-up PCP or return to urgent care as needed.

## 2022-11-06 IMAGING — CT CT L SPINE W/O CM
3 of 4 series · 13 of 33 positions shown, 16 images · non-contrast
Comparison: None.

CLINICAL DATA: Initial evaluation for lower back pain for 1 month.

EXAM:
CT LUMBAR SPINE WITHOUT CONTRAST
TECHNIQUE: Multidetector CT imaging of the lumbar spine was performed without
intravenous contrast administration. Multiplanar CT image
reconstructions were also generated.

[Series 3: multi disc · axial · 0.29mm/px · z∈[-865,-680]mm · 5 of 123 slices shown, 7 images]
[im 21/123  soft-tissue]
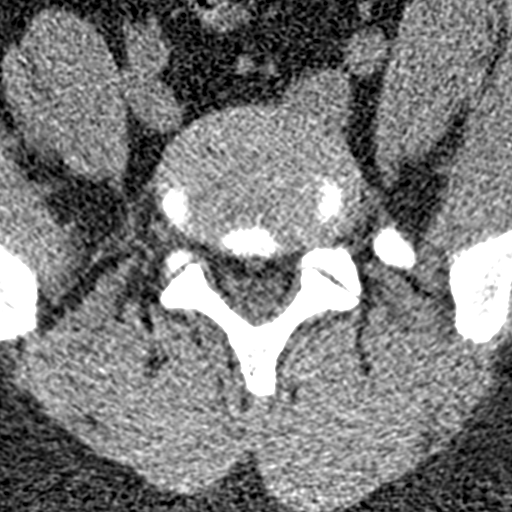
[im 21/123  bone]
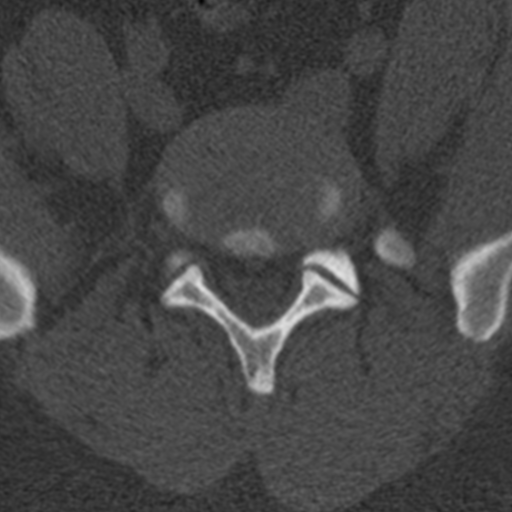
[im 41/123  bone]
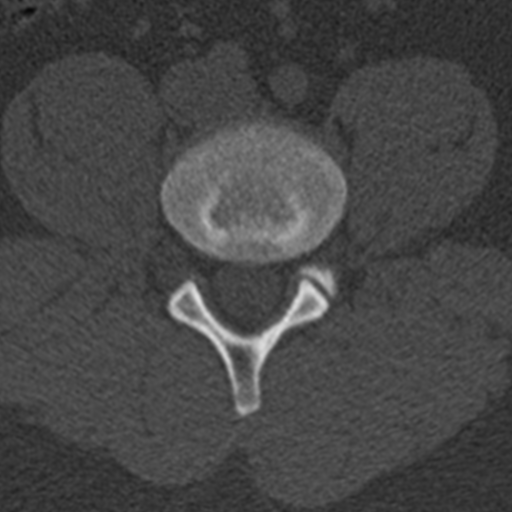
[im 62/123  bone]
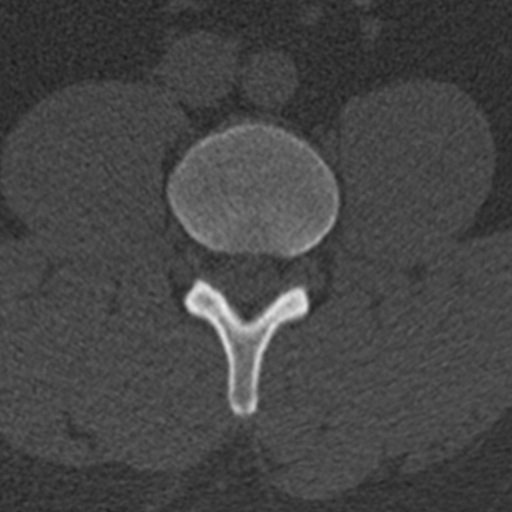
[im 82/123  bone]
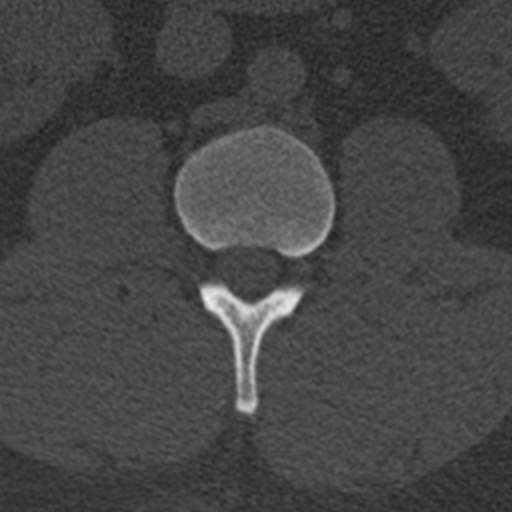
[im 102/123  soft-tissue]
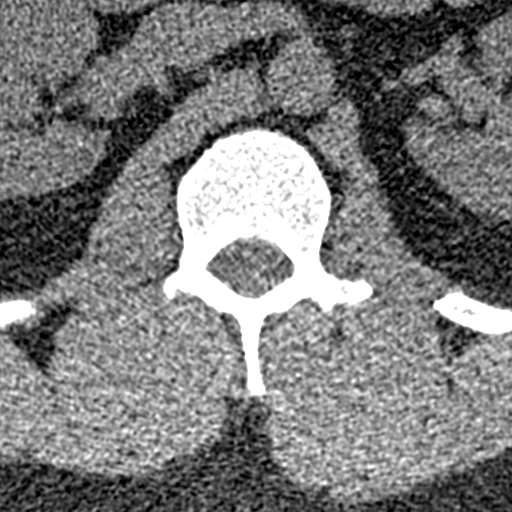
[im 102/123  bone]
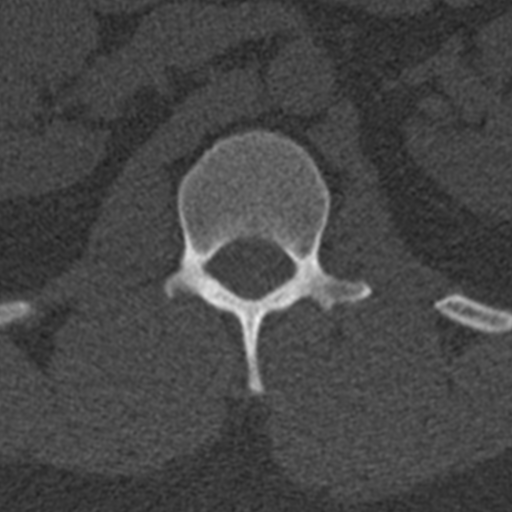

[Series 7: coronal bone · coronal · 0.39mm/px · 3 of 96 slices shown]
[im 20/96  bone]
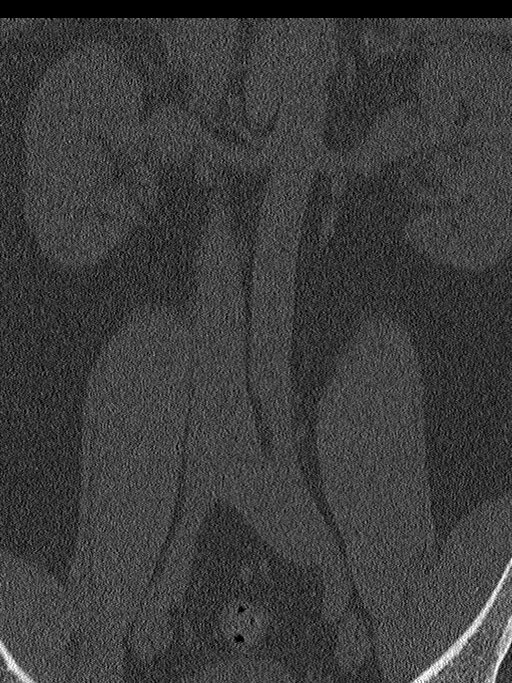
[im 39/96  bone]
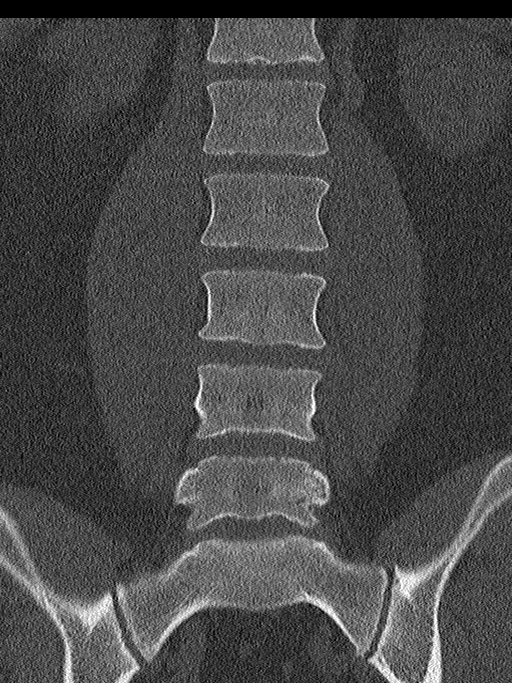
[im 58/96  bone]
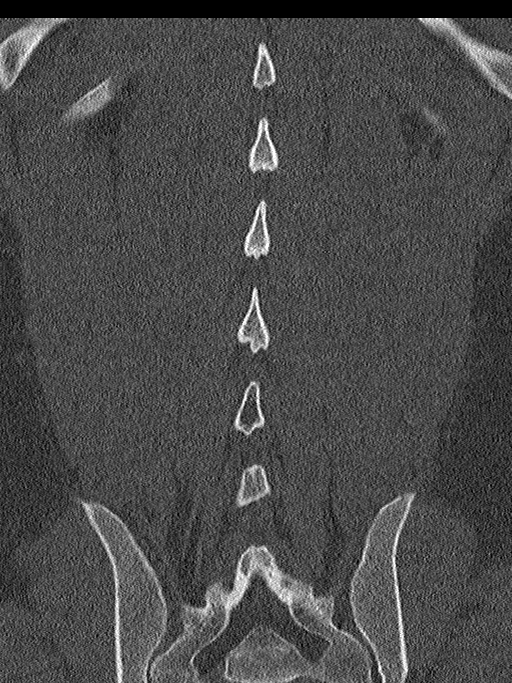

[Series 8: sagittal st · sagittal · 0.44mm/px · 5 of 92 slices shown, 6 images]
[im 31/92  bone]
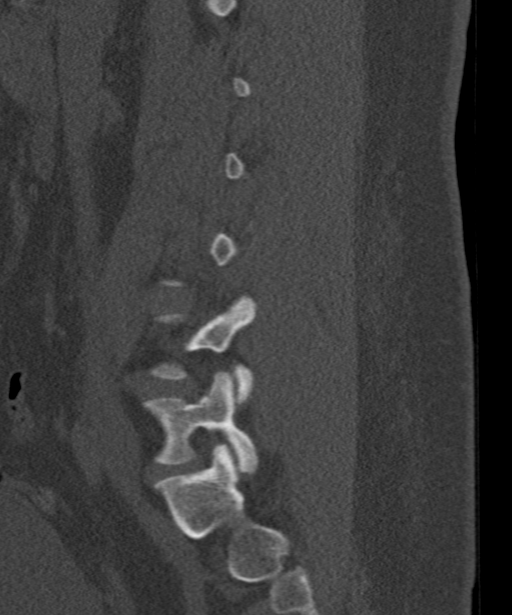
[im 38/92  bone]
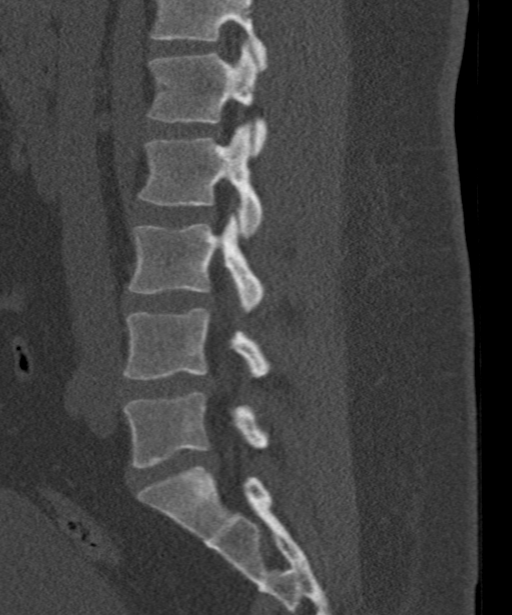
[im 46/92  soft-tissue]
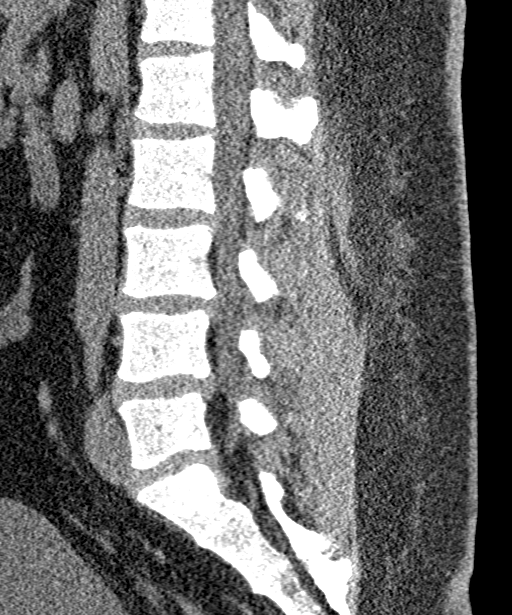
[im 46/92  bone]
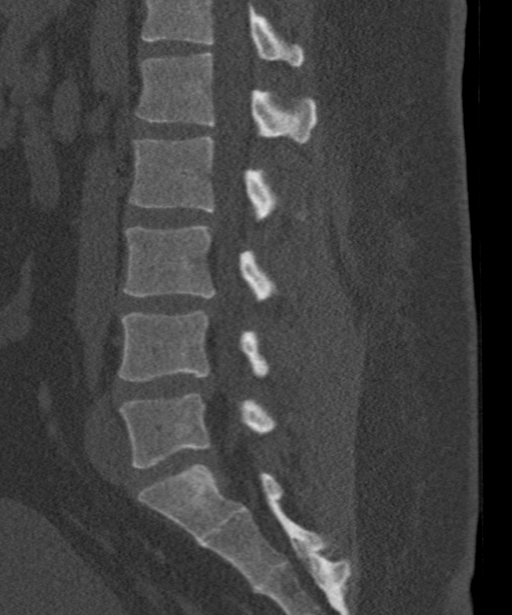
[im 54/92  bone]
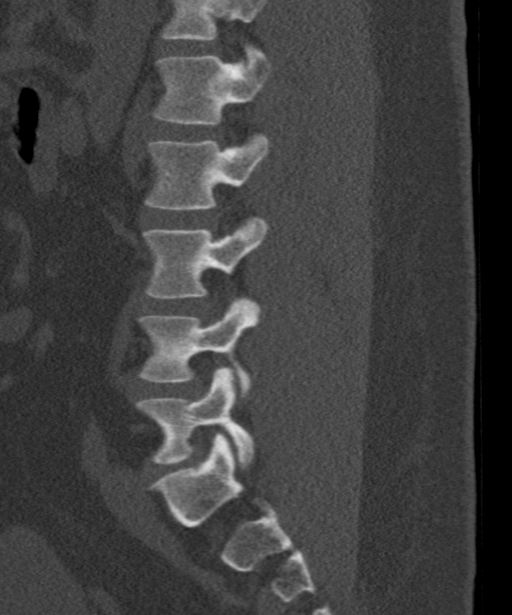
[im 61/92  bone]
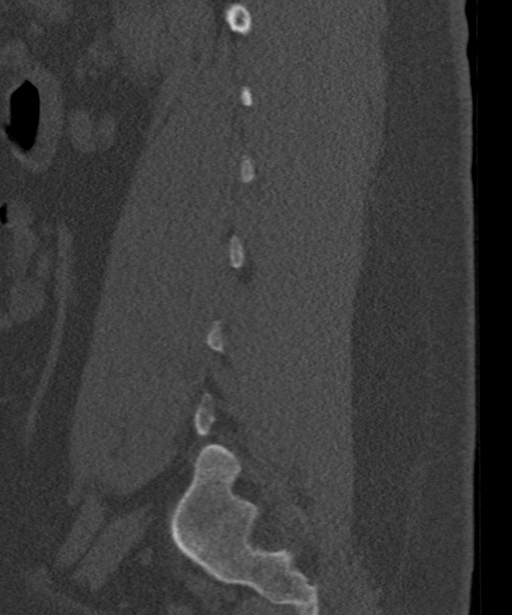

[13 of 33 positions shown; findings below may reference images not displayed]

FINDINGS: Segmentation: Standard. Lowest well-formed disc space labeled the
L5-S1 level.

Alignment: 3 mm retrolisthesis of L5 on S1. Straightening of the
normal lumbar lordosis.

Vertebrae: Vertebral body height maintained without acute or chronic
fracture. Visualized sacrum and pelvis intact. SI joints symmetric
and normal. No discrete osseous lesions.

Paraspinal and other soft tissues: Unremarkable.

Disc levels:

L1-2:  Unremarkable.

L2-3:  Unremarkable.

L3-4: Minimal annular disc bulge. No spinal stenosis. Mild left L3
foraminal narrowing. Right neural foramen remains patent.

L4-5: Mild disc bulge. Superimposed small central disc protrusion
contacts the ventral thecal sac (series 6, image 87). No significant
spinal stenosis. Mild left L4 foraminal narrowing. Right neural
foramina remains patent.

L5-S1: Degenerative intervertebral disc space narrowing with trace
retrolisthesis. Superimposed central disc protrusion with annular
calcification (series 6, image 42). Protruding disc closely
approximates the descending S1 nerve roots without frank
impingement. Minimal facet spurring. No canal or lateral recess
stenosis. Mild bilateral L5 foraminal stenosis.
IMPRESSION: 1. No acute abnormality within the lumbar spine.
2. Small central disc protrusions at L4-5 and L5-S1 without
significant stenosis or frank impingement
3. Mild left L3, L4, and bilateral L5 foraminal narrowing.

## 2022-12-10 ENCOUNTER — Other Ambulatory Visit: Payer: Self-pay

## 2022-12-10 ENCOUNTER — Ambulatory Visit
Admission: RE | Admit: 2022-12-10 | Discharge: 2022-12-10 | Disposition: A | Discharge status: Home / Self Care | Payer: BLUE CROSS/BLUE SHIELD | Source: Ambulatory Visit | Attending: Internal Medicine | Admitting: Internal Medicine

## 2022-12-10 VITALS — BP 128/88 | HR 96 | Temp 98.1°F | Resp 18

## 2022-12-10 DIAGNOSIS — M545 Low back pain, unspecified: Secondary | ICD-10-CM

## 2022-12-10 NOTE — ED Provider Notes (Signed)
[facility]    CSN: [id number] Arrival date & time: 12/10/22  1159      History   Chief Complaint Chief Complaint  Patient presents with   Letter for School/Work    HPI Matthew Montoya is a 37 y.o. male.   Patient presents today needing a work note for recent back injury.  Patient states he recently bent over to pick up a box at work causing subsequent injury to his back.  He does have a history of herniated disc in the past and states that this has seemed to flared up.  He was seen on 7/17 and prescribed Norco, Flexeril, prednisone which he has been taking with improvement in back pain.  Reports the back pain is still minimally present but has improved.  Patient did originally have radiation of pain down his right leg which is now resolved.  Denies urinary frequency, urinary or bowel continence, saddle anesthesia.  Reports that he was originally given a work note that stated that he can return to work in 7 to 10 days but patient reports that his employer is needing a more accurate work note that tells the exact date of when he can return.  He attempted to contact the person who prescribed the medications but they reported that they are not able to help with this note.     History reviewed. No pertinent past medical history.  There are no problems to display for this patient.   History reviewed. No pertinent surgical history.     Home Medications    Prior to Admission medications   Medication Sig Start Date End Date Taking? Authorizing Provider  etodolac (LODINE) 500 MG tablet Take 1 tablet (500 mg total) by mouth 2 (two) times daily. 01/25/21   Cuthriell, Delorise Royals, PA-C  hydrOXYzine (ATARAX/VISTARIL) 25 MG tablet Take 0.5-1 tablets (12.5-25 mg total) by mouth every 8 (eight) hours as needed for itching. 01/06/21   Wallis Bamberg, PA-C  methocarbamol (ROBAXIN) 500 MG tablet Take 1 tablet (500 mg total) by mouth every 6 (six) hours as needed for muscle spasms. 01/25/21    Cuthriell, Delorise Royals, PA-C  ondansetron (ZOFRAN-ODT) 4 MG disintegrating tablet Take 1 tablet (4 mg total) by mouth every 8 (eight) hours as needed for nausea or vomiting. 06/01/21   Gustavus Bryant, FNP  permethrin (ELIMITE) 5 % cream Thoroughly massage cream (30 g for average adult) from head to soles of feet; leave on for 8 to 14 hours before removing (shower or bath). 07/09/22   Ellsworth Lennox, PA-C  phentermine 15 MG capsule  10/06/20   [provider]  predniSONE (STERAPRED UNI-PAK 21 TAB) 10 MG (21) TBPK tablet Take by mouth daily. Take 6 tabs by mouth daily  for 2 days, then 5 tabs for 2 days, then 4 tabs for 2 days, then 3 tabs for 2 days, 2 tabs for 2 days, then 1 tab by mouth daily for 2 days Patient not taking: Reported on 07/09/2022 01/18/21   Gustavus Bryant, FNP    Family History History reviewed. No pertinent family history.  Social History Social History   Tobacco Use   Smoking status: Never   Smokeless tobacco: Never  Substance Use Topics   Alcohol use: Yes    Alcohol/week: 4.0 standard drinks of alcohol    Types: 4 Standard drinks or equivalent per week   Drug use: No     Allergies   Patient has no known allergies.   Review of  Systems Review of Systems Per HPI  Physical Exam Triage Vital Signs ED Triage Vitals [12/10/22 1219]  Encounter Vitals Group     BP 128/88     Systolic BP Percentile      Diastolic BP Percentile      Pulse Rate 96     Resp 18     Temp 98.1 F (36.7 C)     Temp Source Oral     SpO2 98 %     Weight      Height      Head Circumference      Peak Flow      Pain Score 0     Pain Loc      Pain Education      Exclude from Growth Chart    No data found.  Updated Vital Signs BP 128/88 (BP Location: Left Arm)   Pulse 96   Temp 98.1 F (36.7 C) (Oral)   Resp 18   SpO2 98%   Visual Acuity Right Eye Distance:   Left Eye Distance:   Bilateral Distance:    Right Eye Near:   Left Eye Near:    Bilateral Near:      Physical Exam Constitutional:      General: He is not in acute distress.    Appearance: Normal appearance. He is not toxic-appearing or diaphoretic.  HENT:     Head: Normocephalic and atraumatic.  Eyes:     Extraocular Movements: Extraocular movements intact.     Conjunctiva/sclera: Conjunctivae normal.  Pulmonary:     Effort: Pulmonary effort is normal.  Musculoskeletal:       Back:     Comments: Patient has very mild tenderness to palpation to right lower back/upper buttocks area.  No direct spinal tenderness, crepitus, step-off noted.  No swelling noted.  Neurological:     General: No focal deficit present.     Mental Status: He is alert and oriented to person, place, and time. Mental status is at baseline.     Deep Tendon Reflexes: Reflexes are normal and symmetric.  Psychiatric:        Mood and Affect: Mood normal.        Behavior: Behavior normal.        Thought Content: Thought content normal.        Judgment: Judgment normal.      UC Treatments / Results  Labs (all labs ordered are listed, but only abnormal results are displayed) Labs Reviewed - No data to display  EKG   Radiology No results found.  Procedures Procedures (including critical care time)  Medications Ordered in UC Medications - No data to display  Initial Impression / Assessment and Plan / UC Course  I have reviewed the triage vital signs and the nursing notes.  Pertinent labs & imaging results that were available during my care of the patient were reviewed by me and considered in my medical decision making (see chart for details).     Patient has already been treated with steroid therapy, Norco, Flexeril and physical exam reveals improvement in symptoms and no additional imaging or therapy is needed at this time.  Discussed with patient I am not able to provide official restrictions and if this is necessary, he is to follow-up with occupational health, orthopedist, or PCP.   Recommendations were made for patient as far as lifting, pushing, pulling.  Will provide work note for patient today.  Patient verbalized understanding and was agreeable with plan. Final Clinical  Impressions(s) / UC Diagnoses   Final diagnoses:  Acute right-sided low back pain, unspecified whether sciatica present   Discharge Instructions   None    ED Prescriptions   None    PDMP not reviewed this encounter.   Gustavus Bryant, [place] 12/10/22 1242

## 2022-12-10 NOTE — ED Triage Notes (Signed)
Pt sts injured back last week and was told to return to work later this week; pt needs more specific note for return to work on [employer]

## 2022-12-11 DIAGNOSIS — M5126 Other intervertebral disc displacement, lumbar region: Secondary | ICD-10-CM | POA: Diagnosis not present

## 2023-06-16 ENCOUNTER — Other Ambulatory Visit: Payer: Self-pay | Admitting: Nurse Practitioner

## 2023-06-16 ENCOUNTER — Ambulatory Visit
Admission: RE | Admit: 2023-06-16 | Discharge: 2023-06-16 | Disposition: A | Discharge status: Home / Self Care | Payer: Worker's Compensation | Source: Ambulatory Visit | Attending: Nurse Practitioner | Admitting: Nurse Practitioner

## 2023-06-16 DIAGNOSIS — T1490XA Injury, unspecified, initial encounter: Secondary | ICD-10-CM

## 2023-07-22 ENCOUNTER — Ambulatory Visit
Admission: RE | Admit: 2023-07-22 | Discharge: 2023-07-22 | Disposition: A | Discharge status: Home / Self Care | Payer: Worker's Compensation | Source: Ambulatory Visit | Attending: Nurse Practitioner | Admitting: Nurse Practitioner

## 2023-07-22 ENCOUNTER — Other Ambulatory Visit: Payer: Self-pay | Admitting: Nurse Practitioner

## 2023-07-22 DIAGNOSIS — M25522 Pain in left elbow: Secondary | ICD-10-CM
